# Patient Record
Sex: Female | Born: 1955 | Hispanic: No | Marital: Married | Smoking: Current some day smoker
Health system: Southern US, Community
[De-identification: ages and names within clinical notes are randomized; demographics above are authoritative.]

---

## 2007-12-24 ENCOUNTER — Encounter: Payer: Self-pay | Admitting: Family Medicine

## 2008-01-09 ENCOUNTER — Ambulatory Visit: Payer: Self-pay | Admitting: Family Medicine

## 2008-01-09 DIAGNOSIS — Z87891 Personal history of nicotine dependence: Secondary | ICD-10-CM | POA: Insufficient documentation

## 2008-01-09 DIAGNOSIS — J45909 Unspecified asthma, uncomplicated: Secondary | ICD-10-CM | POA: Insufficient documentation

## 2008-01-09 DIAGNOSIS — F209 Schizophrenia, unspecified: Secondary | ICD-10-CM | POA: Insufficient documentation

## 2008-01-10 DIAGNOSIS — K219 Gastro-esophageal reflux disease without esophagitis: Secondary | ICD-10-CM

## 2008-01-10 DIAGNOSIS — M1990 Unspecified osteoarthritis, unspecified site: Secondary | ICD-10-CM

## 2008-01-10 DIAGNOSIS — J449 Chronic obstructive pulmonary disease, unspecified: Secondary | ICD-10-CM

## 2008-01-10 DIAGNOSIS — E785 Hyperlipidemia, unspecified: Secondary | ICD-10-CM

## 2008-01-10 DIAGNOSIS — I10 Essential (primary) hypertension: Secondary | ICD-10-CM | POA: Insufficient documentation

## 2008-02-20 ENCOUNTER — Ambulatory Visit: Payer: Self-pay | Admitting: Family Medicine

## 2008-02-29 ENCOUNTER — Telehealth: Payer: Self-pay | Admitting: Family Medicine

## 2008-04-16 ENCOUNTER — Ambulatory Visit: Payer: Self-pay | Admitting: Family Medicine

## 2008-04-16 LAB — CONVERTED CEMR LAB

## 2008-04-17 ENCOUNTER — Encounter: Payer: Self-pay | Admitting: Family Medicine

## 2008-04-18 ENCOUNTER — Encounter: Payer: Self-pay | Admitting: Family Medicine

## 2008-05-13 ENCOUNTER — Encounter: Payer: Self-pay | Admitting: Family Medicine

## 2008-05-16 ENCOUNTER — Encounter: Payer: Self-pay | Admitting: Family Medicine

## 2008-05-19 LAB — CONVERTED CEMR LAB
ALT: 15 units/L (ref 0–35)
AST: 17 units/L (ref 0–37)
Albumin: 3.8 g/dL (ref 3.5–5.2)
Alkaline Phosphatase: 75 units/L (ref 39–117)
BUN: 12 mg/dL (ref 6–23)
CO2: 22 meq/L (ref 19–32)
Calcium: 9.3 mg/dL (ref 8.4–10.5)
Chloride: 108 meq/L (ref 96–112)
Creatinine, Ser: 0.9 mg/dL (ref 0.40–1.20)
Glucose, Bld: 89 mg/dL (ref 70–99)
Potassium: 4.7 meq/L (ref 3.5–5.3)
Sodium: 140 meq/L (ref 135–145)
TSH: 0.419 microintl units/mL (ref 0.350–4.50)
Total Bilirubin: 0.2 mg/dL — ABNORMAL LOW (ref 0.3–1.2)
Total Protein: 7.1 g/dL (ref 6.0–8.3)

## 2008-06-13 ENCOUNTER — Encounter: Payer: Self-pay | Admitting: Family Medicine

## 2008-07-09 ENCOUNTER — Ambulatory Visit: Payer: Self-pay | Admitting: Family Medicine

## 2008-07-28 ENCOUNTER — Telehealth (INDEPENDENT_AMBULATORY_CARE_PROVIDER_SITE_OTHER): Payer: Self-pay | Admitting: *Deleted

## 2008-07-29 ENCOUNTER — Ambulatory Visit: Payer: Self-pay | Admitting: Family Medicine

## 2008-07-30 ENCOUNTER — Telehealth: Payer: Self-pay | Admitting: Family Medicine

## 2008-08-19 ENCOUNTER — Ambulatory Visit: Payer: Self-pay | Admitting: Family Medicine

## 2008-08-19 ENCOUNTER — Telehealth: Payer: Self-pay | Admitting: *Deleted

## 2008-08-19 ENCOUNTER — Ambulatory Visit (HOSPITAL_COMMUNITY): Admission: RE | Admit: 2008-08-19 | Discharge: 2008-08-19 | Payer: Self-pay | Admitting: Family Medicine

## 2008-08-19 ENCOUNTER — Encounter: Payer: Self-pay | Admitting: Family Medicine

## 2008-08-25 ENCOUNTER — Telehealth: Payer: Self-pay | Admitting: *Deleted

## 2008-08-25 ENCOUNTER — Encounter: Payer: Self-pay | Admitting: Family Medicine

## 2008-08-25 LAB — CONVERTED CEMR LAB
Eosinophils Absolute: 0 10*3/uL (ref 0.0–0.7)
Glucose, Bld: 107 mg/dL — ABNORMAL HIGH (ref 70–99)
Lymphs Abs: 2.2 10*3/uL (ref 0.7–4.0)
MCV: 90.8 fL (ref 78.0–100.0)
Neutrophils Relative %: 63 % (ref 43–77)
Platelets: 373 10*3/uL (ref 150–400)
Potassium: 4.3 meq/L (ref 3.5–5.3)
Sodium: 138 meq/L (ref 135–145)
TSH: 0.802 microintl units/mL (ref 0.350–4.50)
WBC: 7.7 10*3/uL (ref 4.0–10.5)

## 2008-08-27 ENCOUNTER — Ambulatory Visit: Payer: Self-pay | Admitting: Family Medicine

## 2008-08-27 LAB — CONVERTED CEMR LAB: Direct LDL: 245 mg/dL — ABNORMAL HIGH

## 2008-09-01 ENCOUNTER — Encounter: Payer: Self-pay | Admitting: Family Medicine

## 2008-11-12 ENCOUNTER — Ambulatory Visit: Payer: Self-pay | Admitting: Family Medicine

## 2008-11-12 DIAGNOSIS — N949 Unspecified condition associated with female genital organs and menstrual cycle: Secondary | ICD-10-CM

## 2008-11-12 LAB — CONVERTED CEMR LAB
Albumin: 4.2 g/dL (ref 3.5–5.2)
BUN: 13 mg/dL (ref 6–23)
CO2: 24 meq/L (ref 19–32)
Calcium: 9.5 mg/dL (ref 8.4–10.5)
Chloride: 102 meq/L (ref 96–112)
Glucose, Bld: 90 mg/dL (ref 70–99)
Hemoglobin: 12.4 g/dL (ref 12.0–15.0)
MCHC: 32.2 g/dL (ref 30.0–36.0)
Potassium: 4.4 meq/L (ref 3.5–5.3)
RBC: 4.04 M/uL (ref 3.87–5.11)

## 2008-11-13 ENCOUNTER — Encounter: Payer: Self-pay | Admitting: Family Medicine

## 2008-11-19 ENCOUNTER — Telehealth: Payer: Self-pay | Admitting: Family Medicine

## 2009-05-01 ENCOUNTER — Ambulatory Visit: Payer: Self-pay | Admitting: Family Medicine

## 2009-05-01 ENCOUNTER — Encounter: Admission: RE | Admit: 2009-05-01 | Discharge: 2009-05-01 | Payer: Self-pay | Admitting: Family Medicine

## 2009-07-17 ENCOUNTER — Telehealth: Payer: Self-pay | Admitting: *Deleted

## 2009-11-18 ENCOUNTER — Ambulatory Visit: Payer: Self-pay | Admitting: Family Medicine

## 2009-11-18 LAB — CONVERTED CEMR LAB
ALT: 16 units/L (ref 0–35)
AST: 21 units/L (ref 0–37)
Albumin: 4.2 g/dL (ref 3.5–5.2)
Calcium: 9.7 mg/dL (ref 8.4–10.5)
Chloride: 101 meq/L (ref 96–112)
Potassium: 3.9 meq/L (ref 3.5–5.3)
Sodium: 139 meq/L (ref 135–145)

## 2009-11-20 ENCOUNTER — Encounter: Payer: Self-pay | Admitting: Family Medicine

## 2010-03-17 ENCOUNTER — Ambulatory Visit: Payer: Self-pay | Admitting: Family Medicine

## 2010-03-17 DIAGNOSIS — R7301 Impaired fasting glucose: Secondary | ICD-10-CM

## 2010-03-17 LAB — CONVERTED CEMR LAB
Albumin: 4.2 g/dL (ref 3.5–5.2)
Alkaline Phosphatase: 87 units/L (ref 39–117)
BUN: 12 mg/dL (ref 6–23)
CO2: 24 meq/L (ref 19–32)
Direct LDL: 233 mg/dL — ABNORMAL HIGH
Glucose, Bld: 109 mg/dL — ABNORMAL HIGH (ref 70–99)
Potassium: 4 meq/L (ref 3.5–5.3)
Total Bilirubin: 0.3 mg/dL (ref 0.3–1.2)

## 2010-03-18 ENCOUNTER — Encounter: Payer: Self-pay | Admitting: Family Medicine

## 2010-03-19 ENCOUNTER — Telehealth (INDEPENDENT_AMBULATORY_CARE_PROVIDER_SITE_OTHER): Payer: Self-pay | Admitting: Family Medicine

## 2010-03-23 ENCOUNTER — Encounter: Payer: Self-pay | Admitting: Family Medicine

## 2010-04-01 ENCOUNTER — Encounter: Payer: Self-pay | Admitting: Family Medicine

## 2010-05-26 ENCOUNTER — Ambulatory Visit: Payer: Self-pay | Admitting: Family Medicine

## 2010-05-26 DIAGNOSIS — R252 Cramp and spasm: Secondary | ICD-10-CM

## 2010-05-26 LAB — CONVERTED CEMR LAB
ALT: 14 units/L (ref 0–35)
Albumin: 3.9 g/dL (ref 3.5–5.2)
Alkaline Phosphatase: 81 units/L (ref 39–117)
Glucose, Bld: 115 mg/dL — ABNORMAL HIGH (ref 70–99)
Potassium: 4.4 meq/L (ref 3.5–5.3)
Sodium: 141 meq/L (ref 135–145)
Total Protein: 6.8 g/dL (ref 6.0–8.3)

## 2010-05-28 ENCOUNTER — Encounter: Payer: Self-pay | Admitting: Family Medicine

## 2010-07-05 ENCOUNTER — Encounter: Payer: Self-pay | Admitting: Family Medicine

## 2010-08-25 ENCOUNTER — Encounter (INDEPENDENT_AMBULATORY_CARE_PROVIDER_SITE_OTHER): Payer: Self-pay | Admitting: *Deleted

## 2010-09-08 ENCOUNTER — Ambulatory Visit: Payer: Self-pay

## 2010-09-10 ENCOUNTER — Telehealth: Payer: Self-pay | Admitting: Family Medicine

## 2010-09-15 ENCOUNTER — Telehealth: Payer: Self-pay | Admitting: Family Medicine

## 2010-10-26 NOTE — Assessment & Plan Note (Signed)
Summary: f/u,df   Vital Signs:  Patient profile:   55 year old female Height:      63 inches Weight:      181.9 pounds BMI:     32.34 Temp:     97.5 degrees F oral Pulse rate:   82 / minute BP sitting:   147 / 88  (left arm) Cuff size:   large  Vitals Entered By: Gladstone Pih (November 18, 2009 9:45 AM) CC: F/U Is Patient Diabetic? No Pain Assessment Patient in pain? yes      Onset of pain  Chronic   Primary Care Provider:  Denny Levy, MD  CC:  F/U.  History of Present Illness: f/u htn: has been seen at ED and at [facility] clinic and by her psychioatrist since I saw her and there have been some medication changes. She needs some refills. She is not sure what all they changed or why but did bring her meds today. Plans to continue f/u here. Not having as many headaches--thinks thisis improved because she has moved out of te family home into her own apartment. She is quite happy about this,,said her stress is less. She is living in a 'senior center" where they have a lot of activities.  f/u L>R knee pain--saw Dr Celene Skeen sent her to pulminary to get clearance for TKR. She can barely walk now even with her cane--wants to use a walker as she thinks it will support her better---she tried her neighbors.  f/u peripheral neuropathy--seems to be g3etting much worse---her gabapentin has been increased and she says that is helping a little bit. Is scheduled to see a neurologist by her ortho doctor---has appt todat (Dr Ninetta Lights in [place])  saw a GYN docotro who did a pap, an [place] and some kind of biopsy--she had an episode of vaginal leeding. he told her she needs a hysterectomy---she wants to get her knee fixed first.  still following at mental health center--some of those meds changed too. feeling better since she moved out into her ownplace. denies current SI/HI. Says her husband is still in touch daily--he brought her here today..  Still smooking cigarettes, not currently drinking  alcohol.  Habits & Providers  Alcohol-Tobacco-Diet     Tobacco Status: current     Tobacco Counseling: to quit use of tobacco products     Cigarette Packs/Day: 1.0  Current Medications (verified): 1)  Lotensin Hct 20-12.5 Mg Tabs (Benazepril-Hydrochlorothiazide) .Marland Kitchen.. 1 By Mouth Qd 2)  Amlodipine Besylate 10 Mg  Tabs (Amlodipine Besylate) .Marland Kitchen.. 1 By Mouth Once Daily 3)  Advair Diskus 500-50 Mcg/dose  Misc (Fluticasone-Salmeterol) .Marland Kitchen.. 1 Puff Two Times A Day 4)  Risperdal 3 Mg Tabs (Risperidone) .... 2 By Mouth At Bedtime Per Psychiatry 5)  Remeron 45 Mg Tabs (Mirtazapine) .Marland Kitchen.. 1 By Mouth At Bedtime Per Psych 6)  Vicodin 5-500 Mg Tabs (Hydrocodone-Acetaminophen) .Marland Kitchen.. 1-2 By Mouth At Bedtime As Needed Knee Pain Ros #60/month 7)  Benazepril Hcl 40 Mg  Tabs (Benazepril Hcl) .Marland Kitchen.. 1 By Mouth Qd 8)  Neurontin 300 Mg  Caps (Gabapentin) .Marland Kitchen.. 1 By Mouth Tid 9)  Clonidine Hcl 0.1 Mg Tabs (Clonidine Hcl) .Marland Kitchen.. 1 By Mouth Bid 10)  Alprazolam 0.5 Mg Tabs (Alprazolam) .Marland Kitchen.. 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   Neck:  supple, full ROM, and no masses.   Lungs:  decreased breath sounds in all lung fields, no rales, no crackles, no wheezes Heart:  normal rate, regular rhythm, and no murmur.   Abdomen:  soft and normal bowel sounds.   Msk:  L knee painful medialandlateral joint lines, very limited ROm--will extend  almost fully but flexion slow and about 80 degrees. Neurologic:  antalgic gait using canealert & oriented X3 and strength normal in all extremities.     Impression & Recommendations:  Problem # 1:  HYPERTENSION (ICD-401.9) Assessment Improved Orders: Comp Met-[facility] ([id number]) [facility]- Est  Level 4 ([address]214)refill her meds unclear to me who changed some of her meds or why but better control today than at some points in past  Problem # 2:  MENORRHAGIA, PERIMENOPAUSAL (ICD-626.8)  she is evidently seeing a gynecologist in [facility] for this and I will let her follow up w him---he has recommended hysterectomy according to her. 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 Will give her small amt narcotic for pain relief until this accomplished Orders: [facility]- Est  Level 4 ([address])  Complete Medication List: 1)  Lotensin Hct 20-12.5 Mg Tabs (Benazepril-hydrochlorothiazide) .Marland Kitchen.. 1 by mouth qd 2)  Amlodipine Besylate 10 Mg Tabs (Amlodipine besylate) .Marland Kitchen.. 1 by mouth once daily 3)  Advair Diskus 500-50 Mcg/dose Misc (Fluticasone-salmeterol) .Marland Kitchen.. 1 puff two times a day 4)  Risperdal 3 Mg Tabs (Risperidone) .... 2 by mouth at bedtime per psychiatry 5)  Remeron 45 Mg Tabs (Mirtazapine) .Marland Kitchen.. 1 by mouth at bedtime per psych 6)  Vicodin 5-500 Mg Tabs (Hydrocodone-acetaminophen) .Marland Kitchen.. 1-2 by mouth at bedtime as needed knee pain ros #60/month 7)  Neurontin 300 Mg Caps (Gabapentin) .Marland Kitchen.. 1 by mouth tid 8)  Clonidine Hcl 0.1 Mg Tabs (Clonidine hcl) .Marland Kitchen.. 1 by mouth bid 9)  Alprazolam 0.5 Mg Tabs (Alprazolam) .Marland Kitchen.. 1 by mouth at bedtime per psychiatry 10)  Benztropine Mesylate 2  Mg Tabs (Benztropine mesylate) .Marland Kitchen.. 1 by mouth once daily per psychiatry T-Lipoprotein (LDL cholesterol)  ([id number]) Comp Met-[facility] ([id number]) [facility]- Est  Level 4 (03474)  Patient Instructions: 1)  RTC 2-3 m 2)  get me what records you can from your other doctors, esp gynecologist.  3)  we will check some blood work today and I will send you a letter. great to see you! Prescriptions: CLONIDINE HCL 0.1 MG TABS (CLONIDINE HCL) 1 by mouth bid  #60 x 5   Entered and Authorized by:   Denny Levy MD   Signed by:   Denny Levy MD on 11/18/2009   Method used:   Electronically to        [facility] [phone]* (retail)       [address]       [place], [place]  [address]       Ph: [phone]  Fax: [phone]   RxID:   [id number] NEURONTIN 300 MG  CAPS (GABAPENTIN) 1 by mouth tid  #90 x 5   Entered and Authorized by:   Denny Levy MD   Signed by:   Denny Levy MD on 11/18/2009   Method used:   Electronically to        [facility] [phone]* (retail)       [address]       [place], [place]  [address]       Ph: [phone]       Fax: [phone]   RxID:   [id number] AMLODIPINE BESYLATE 10 MG  TABS (AMLODIPINE BESYLATE) 1 by mouth once daily  #90 x 3   Entered and Authorized by:   Denny Levy MD   Signed by:   Denny Levy MD on 11/18/2009   Method used:   Electronically to        [facility] [phone]* (retail)       [address]       [place], [place]  [address]       Ph: [phone]       Fax: [phone]   RxID:   [id number] LOTENSIN HCT 20-12.5 MG TABS (BENAZEPRIL-HYDROCHLOROTHIAZIDE) 1 by mouth qd  #30 x 12   Entered and Authorized by:   Denny Levy MD   Signed by:   Denny Levy MD on 11/18/2009   Method used:   Electronically to        [facility] [phone]* (retail)       [address]       [place], [place]  [address]       Ph: [phone]       Fax: [phone]   RxID:   [id number]   Prevention & Chronic  Care Immunizations   Influenza vaccine: given  (07/09/2008)   Influenza vaccine due: 07/09/2009    Tetanus booster: 12/26/2003: given   Tetanus booster due: 12/25/2013    Pneumococcal vaccine: Not documented  Colorectal Screening   Hemoccult: Not documented   Hemoccult due: Not Indicated    Colonoscopy: normal  (06/27/2007)   Colonoscopy due: 06/26/2017  Other Screening   Pap smear: Not documented   Pap smear action/deferral: Not indicated-other  (11/18/2009)    Mammogram: normal  (02/09/2007)   Mammogram due: 02/09/2008   Smoking status: current  (11/18/2009)   Smoking cessation counseling: yes  (11/12/2008)  Lipids   Total Cholesterol: Not documented   Lipid panel action/deferral: LDL Direct Ordered   LDL: Not documented   LDL Direct: 245  (08/27/2008)   HDL: Not documented   Triglycerides: Not documented    SGOT (AST): 17  (11/12/2008)   SGPT (ALT): 16  (11/12/2008) CMP ordered    Alkaline phosphatase: 83  (11/12/2008)   Total bilirubin: 0.2  (11/12/2008)    Lipid flowsheet reviewed?: Yes   Progress toward LDL goal: Improved  Hypertension   Last Blood Pressure: 147 / 88  (11/18/2009)   Serum creatinine: 0.82  (11/12/2008)   Serum potassium 4.4  (11/12/2008) CMP ordered     Hypertension flowsheet reviewed?: Yes   Progress toward BP goal: Unchanged  Self-Management Support :   Personal Goals (by the next clinic visit) :      Personal blood pressure goal: 140/90  (11/18/2009)   Patient will work on the following items until the next clinic visit to reach self-care goals:  Medications and monitoring: take my medicines every day  (11/18/2009)     Eating: limit or avoid alcohol  (11/18/2009)    Hypertension self-management support: Written self-care plan  (11/18/2009)   Hypertension self-care plan printed.    Lipid self-management support: Not documented     Impression & Recommendations:  Her updated medication list for this problem includes:     Lotensin Hct 20-12.5 Mg Tabs (Benazepril-hydrochlorothiazide) .Marland Kitchen... 1 by mouth qd    Amlodipine Besylate 10 Mg Tabs (Amlodipine besylate) .Marland Kitchen... 1 by mouth once daily    Benazepril Hcl 40 Mg Tabs (Benazepril hcl) .Marland Kitchen... 1 by mouth qd    Clonidine Hcl 0.1 Mg Tabs (Clonidine hcl) .Marland Kitchen... 1 by mouth bid  Orders: Comp Met-[facility] ([id number])  Her updated medication list for this problem includes:    Lotensin Hct 20-12.5 Mg Tabs (Benazepril-hydrochlorothiazide) .Marland Kitchen... 1 by mouth qd    Amlodipine Besylate 10 Mg Tabs (Amlodipine besylate) .Marland Kitchen... 1 by mouth once daily    Benazepril Hcl 40 Mg Tabs (Benazepril hcl) .Marland Kitchen... 1 by mouth qd    Clonidine Hcl 0.1 Mg Tabs (Clonidine hcl) .Marland Kitchen... 1 by mouth bid  Her updated medication list for this problem includes:    Lotensin Hct 20-12.5 Mg Tabs (Benazepril-hydrochlorothiazide) .Marland Kitchen... 1 by mouth qd    Amlodipine Besylate 10 Mg Tabs (Amlodipine besylate) .Marland Kitchen... 1 by mouth once daily    Benazepril Hcl 40 Mg Tabs (Benazepril hcl) .Marland Kitchen... 1 by mouth qd    Clonidine Hcl 0.1 Mg Tabs (Clonidine hcl) .Marland Kitchen... 1 by mouth bid  The following medications were removed from the medication list:    Benazepril Hcl 40 Mg Tabs (Benazepril hcl) .Marland Kitchen... 1 by mouth qd  Her updated medication list for this problem includes:    Lotensin Hct 20-12.5 Mg Tabs (Benazepril-hydrochlorothiazide) .Marland Kitchen... 1 by mouth qd    Amlodipine Besylate 10 Mg Tabs (Amlodipine besylate) .Marland Kitchen... 1 by mouth once daily    Clonidine Hcl 0.1 Mg Tabs (Clonidine hcl) .Marland Kitchen... 1 by mouth bid   Orders: Comp Met-[facility] [phone]) [facility]- Est  Level 4 (84696)     Orders: [facility]- Est  Level 4 ([address])    Orders: T-Lipoprotein (LDL cholesterol)  ([id number]) Comp Met-[facility] ([id number])   Orders: T-Lipoprotein (LDL cholesterol)  ([id number]) Comp Met-[facility] ([id number]) [facility]- Est  Level 4 (88416)    The following medications were removed from the medication list:    Hydrocodone-acetaminophen 5-500 Mg Tabs  (Hydrocodone-acetaminophen) .Marland Kitchen... 1 tab by mouth q 6 hours as needed cough for next week  Her updated medication list for this problem includes:    Vicodin 5-500 Mg Tabs (Hydrocodone-acetaminophen) .Marland Kitchen... 1-2 by mouth at bedtime as needed knee pain ros #60/month  The following medications were removed from the medication list:    Hydrocodone-acetaminophen 5-500 Mg Tabs (Hydrocodone-acetaminophen) .Marland Kitchen... 1 tab by mouth q 6 hours as needed cough for next week  Her updated medication list for this problem includes:    Vicodin 5-500 Mg Tabs (Hydrocodone-acetaminophen) .Marland Kitchen... 1-2 by mouth at bedtime as needed knee pain ros #60/month  The following medications were removed from the medication list:    Hydrocodone-acetaminophen 5-500 Mg Tabs (Hydrocodone-acetaminophen) .Marland Kitchen... 1 tab by mouth q 6 hours as needed cough for next week  Her updated medication list for this problem includes:    Vicodin 5-500 Mg Tabs (Hydrocodone-acetaminophen) .Marland Kitchen... 1-2 by mouth at bedtime as needed knee pain ros #60/month   Orders: [facility]- Est  Level 4 (60630)    Complete Medication List: 1)  Lotensin Hct 20-12.5 Mg Tabs (Benazepril-hydrochlorothiazide) .Marland Kitchen.. 1 by mouth qd 2)  Amlodipine Besylate 10 Mg Tabs (Amlodipine besylate) .Marland Kitchen.. 1 by mouth once daily 3)  Advair Diskus 500-50 Mcg/dose Misc (Fluticasone-salmeterol) .Marland Kitchen.. 1 puff two times a day 4)  Risperdal 3 Mg Tabs (Risperidone) .... 2 by mouth at bedtime per psychiatry 5)  Remeron 45 Mg Tabs (Mirtazapine) .Marland Kitchen.. 1 by mouth at bedtime per psych 6)  Vicodin 5-500 Mg Tabs (Hydrocodone-acetaminophen) .Marland Kitchen.. 1-2 by mouth at bedtime as needed knee pain ros #60/month 7)  Neurontin 300 Mg Caps (Gabapentin) .Marland Kitchen.. 1 by mouth tid 8)  Clonidine Hcl 0.1 Mg Tabs (Clonidine hcl) .Marland Kitchen.. 1 by mouth bid 9)  Alprazolam 0.5 Mg Tabs (Alprazolam) .Marland Kitchen.. 1 by mouth at bedtime per psychiatry 10)  Benztropine Mesylate 2 Mg Tabs (Benztropine mesylate) .Marland Kitchen.. 1 by mouth once daily per psychiatry

## 2010-10-26 NOTE — Letter (Signed)
Summary: Generic Letter  [facility]  [address]   [place], [place] [address]   Phone: [phone]  Fax: [phone]    04/01/2010  REGARDING:  JUNELL CULLIFER [address] [place], [place]  [address]     TO: Whom it may concern Re: Housing situation for: JACYNDA BRUNKE  [place], [place]  [address]  Please allow Mrs. Keltz's son, Dante Gang, to stay with Mrs. Grosso at various times throughout the year. Mrs. Diego is having severe arthritis pproblems in her knees, and at times her son may be staying with her to help her with activities of daily living, cleaning etc.  Please contact me with questions.               Sincerely,   Denny Levy MD

## 2010-10-26 NOTE — Letter (Signed)
Summary: Generic Letter: Housing  [facility]  [address]   [place], [place] [address]   Phone: [phone]  Fax: [phone]    03/19/2010   TO: Whom it may concern Re: Housing situation for: Christine Hebert  [place], [place]  [address]  Please allow Christine Hebert's son, Christine Hebert, to stay with Christine Hebert at various times throughout the year. Christine Hebert is having severe arthritis pproblems in her knees, and at times her son may be staying with her to help her with activities of daioly living, cleaning etc.  Please contact me with questions.          Sincerely,   Denny Levy MD  Appended Document: Generic Letter: Housing mailed  Appended Document: Generic Letter: Housing pt called and said that her landlord questioned the letter (shoe thought the pt had written it) and would like another one sent- needs to be addressed to [facility], [address], [place], [address] attn: management  letter needs to be rewritten w/ no typo's and hand signed

## 2010-10-26 NOTE — Letter (Signed)
Summary: [facility]  [address]   [place], [place] [address]   Phone: [phone]  Fax: [phone]    03/18/2010  Christine Hebert [address] [place], [place]  [address]  Dear [name],   You have only very MILDLY elevated blood sygar. I would NOT recommend we restart the metformin or any other diabetes drugs atthis time. We will continue to folow that.  All of your other blood work was satisfactory as well.  Great to see you!        Sincerely,   Denny Levy MD  Appended Document: LABLetter MAILED.

## 2010-10-26 NOTE — Assessment & Plan Note (Signed)
Summary: f/u,df   Vital Signs:  Patient profile:   55 year old female Weight:      181.7 pounds Temp:     97.8 degrees F oral Pulse rate:   75 / minute Pulse rhythm:   regular BP sitting:   149 / 89  (left arm) Cuff size:   large  Vitals Entered By: Loralee Pacas CMA (May 26, 2010 10:20 AM)  Primary Care Provider:  Denny Levy, MD   History of Present Illness: 1) left shoulderpain--worse with any activities above shoulder height. No injury. Insiduous. Painis aching. Sleep disruption as she cannot sleep on that side. No nymbness or tingling in arm or hand. Righthand dominant  2) recent 9 day hospitalization at [facility] inpat psych unit. Was haveing paranoia and hallucinations. They changed some of her meds. Rush Landmark is tempporarily back in her husbands home, but plans to move back toher own apartment. Not having any suicidal or homicidal ideations at his time--feels much better since d/c. Unclear if maybe she went off her meds for a while prior to admit.   3) Muscle cramps--especially feet. Sometimes awakens her from sleep. Resolves with standing usually.  Intermittent. Occasionally hands.  Current Medications (verified): 1)  Lotensin Hct 20-12.5 Mg Tabs (Benazepril-Hydrochlorothiazide) .Marland Kitchen.. 1 By Mouth Qd 2)  Advair Diskus 500-50 Mcg/dose  Misc (Fluticasone-Salmeterol) .Marland Kitchen.. 1 Puff Two Times A Day 3)  Invega 9 Mg Xr24h-Tab (Paliperidone) .... Per Psych 4)  Vicodin 5-500 Mg Tabs (Hydrocodone-Acetaminophen) .Marland Kitchen.. 1-2 By Mouth At Bedtime As Needed Knee Pain Ros #60/month 5)  Clonidine Hcl 0.1 Mg Tabs (Clonidine Hcl) .Marland Kitchen.. 1 By Mouth Bid 6)  Buspirone Hcl 10 Mg Tabs (Buspirone Hcl) .... Per Psych 7)  Benztropine Mesylate 2 Mg Tabs (Benztropine Mesylate) .Marland Kitchen.. 1 By Mouth Once Daily Per Psychiatry  Allergies: 1)  * Clindamycin 2)  * Metformin  Review of Systems  The patient denies anorexia, fever, weight loss, weight gain, and dyspnea on exertion.   Psych:  Complains of easily tearful and  mental problems; denies alternate hallucination ( auditory/visual), anxiety, depression, easily angered, panic attacks, suicidal thoughts/plans, unusual visions or sounds, and thoughts /plans of harming others; Please see HPI for additional ROS. .  Physical Exam  General:  alert, well-developed, well-nourished, and well-hydrated.   Neck:  supple, full ROM, and no masses.   Lungs:  normal respiratory effort and normal breath sounds.   Heart:  normal rate, regular rhythm, and no murmur.   Msk:  Left shoulder pain with abduction laterally above shoulder height and forward flexion above 120 degrees but full strength in all planes of rotator cuff muscles. Distally neurovascularly intact. Neurologic:  alert & oriented X3.   Psych:  memory intact for recent and remote.  some pressured speech and a bit of repeptiion and rambling, but I could generally follow her train of thought. Additional Exam:  Patient given informed consent for injection. Discussed possible complications of infection, bleeding or skin atrophy at site of injection. Possible side effect of avascular necrosis (focal area of bone death) due to steroid use.Appropriate verbal time out taken Are cleaned and prepped in usual sterile fashion. A --1-- cc kennalog plus ---4-cc 1% lidocaine without epinephrine was injected into the-left subacromial bursa using posterior approach--. Patient tolerated procedure well with no complications.    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 Has good f/up arranged with Dr Derryl Harbor ad MH  Problem # 3:  OSTEOARTHRITIS (ICD-715.90)  Her updated medication list for this problem includes:    Vicodin 5-500 Mg Tabs (Hydrocodone-acetaminophen) .Marland Kitchen... 1-2 by  mouth at bedtime as needed knee pain ros #60/month  Orders: [facility]- Est  Level 4 ([address]214) left shoulder injection  Problem # 4:  IMPAIRED FASTING GLUCOSE (ICD-790.21) again we discussed this will follow  Complete Medication List: 1)  Lotensin Hct 20-12.5 Mg Tabs (Benazepril-hydrochlorothiazide) .Marland Kitchen.. 1 by mouth qd 2)  Advair Diskus 500-50 Mcg/dose Misc (Fluticasone-salmeterol) .Marland Kitchen.. 1 puff two times a day 3)  Invega 9 Mg Xr24h-tab (Paliperidone) .... Per psych 4)  Vicodin 5-500 Mg Tabs (Hydrocodone-acetaminophen) .Marland Kitchen.. 1-2 by mouth at bedtime as needed knee pain ros #60/month 5)  Clonidine Hcl 0.1 Mg Tabs (Clonidine hcl) .Marland Kitchen.. 1 by mouth bid 6)  Buspirone Hcl 10 Mg Tabs (Buspirone hcl) .... Per psych 7)  Benztropine Mesylate 2 Mg Tabs (Benztropine mesylate) .Marland Kitchen.. 1 by mouth once daily per psychiatry  Other Orders: Comp Met-[facility] [phone])

## 2010-10-26 NOTE — Letter (Signed)
Summary: [facility]  [address]   [place], [place] [address]   Phone: [phone]  Fax: [phone]    05/28/2010  [facility] [address], [address] [place], [place]  [address]  Dear Ms. Fees,   Thyroid and electrolytes normal. I have no idea why you are having foot and hand cramps. Nothing here to explain it.        Sincerely,   Denny Levy MD

## 2010-10-26 NOTE — Miscellaneous (Signed)
  Clinical Lists Changes  Medications: Added new medication of SIMVASTATIN 80 MG TABS (SIMVASTATIN) 1 by mouth qd - Signed Rx of SIMVASTATIN 80 MG TABS (SIMVASTATIN) 1 by mouth qd;  #30 x 12;  Signed;  Entered by: Denny Levy MD;  Authorized by: Denny Levy MD;  Method used: Electronically to [facility] [phone]*, [address], [place], [place]  [address], Ph: [phone], Fax: [phone]    Prescriptions: SIMVASTATIN 80 MG TABS (SIMVASTATIN) 1 by mouth qd  #30 x 12   Entered and Authorized by:   Denny Levy MD   Signed by:   Denny Levy MD on 11/20/2009   Method used:   Electronically to        [facility] [phone]* (retail)       [address]       [place], [place]  [address]       Ph: [phone]       Fax: [phone]   RxID:   [id number]

## 2010-10-26 NOTE — Progress Notes (Signed)
Summary: meds  Phone Note Call from Patient Call back at [phone]   Caller: Patient Summary of Call: pt is asking about meds that were supposed to be called in the other day [facility] [address] Initial call taken by: De Nurse,  March 19, 2010 11:50 AM  Follow-up for Phone Call        DEAR Raechelle Sarti TEAM please call in as below Thanks!  Denny Levy MD  March 19, 2010 2:48 PM     Prescriptions: ALPRAZOLAM 0.5 MG TABS (ALPRAZOLAM) 1 by mouth at bedtime per psychiatry  #30 x 0   Entered and Authorized by:   Denny Levy MD   Signed by:   Denny Levy MD on 03/19/2010   Method used:   Telephoned to ...       [facility] [phone]* (retail)       [address]       [place], [place]  [address]       Ph: [phone]       Fax: [phone]   RxID:   [id number] CLONIDINE HCL 0.1 MG TABS (CLONIDINE HCL) 1 by mouth bid  #60 x 5   Entered and Authorized by:   Denny Levy MD   Signed by:   Denny Levy MD on 03/19/2010   Method used:   Telephoned to ...       [facility] [phone]* (retail)       [address]       [place], [place]  [address]       Ph: [phone]       Fax: [phone]   RxID:   [id number]  rx called in and informed by Archie Patten on Friday.Gladstone Pih  March 22, 2010 9:12 AM

## 2010-10-26 NOTE — Miscellaneous (Signed)
  Clinical Lists Changes  Problems: Changed problem from ASTHMA, EXTRINSIC (ICD-493.00) to ASTHMA, PERSISTENT, SEVERE (ICD-493.90)

## 2010-10-26 NOTE — Letter (Signed)
Summary: LAB Letter  [facility]  [address]   [place], [place] [address]   Phone: [phone]  Fax: [phone]    11/20/2009  Christine Hebert [address] [place], [place]  [address]  Dear Ms. Petrovich,  Your blood sugar, kidney function, liver function and electrolytes looked normal. Your LD cholesterol, which I would like close to 100 is 208. We need to consider putting you back on a cholesterol medicine.  I have sent a prescription to your pharmacy for a good cholesterol medicine. Please start taking one  a day. We will recheck when I see you back. If you have questions or concerns, call me.  It was great to see you--congratulations on your new apartment!         Sincerely,   Denny Levy MD  Appended Document: LAB Letter mailed.

## 2010-10-26 NOTE — Assessment & Plan Note (Signed)
Summary: f/up,tcb   Vital Signs:  Patient profile:   55 year old female Height:      63 inches Weight:      181 pounds BMI:     32.18 Temp:     97.7 degrees F oral Pulse rate:   69 / minute BP sitting:   118 / 80  (left arm) Cuff size:   large  Vitals Entered By: Tessie Fass CMA (March 17, 2010 9:06 AM)  CC: F/U Is Patient Diabetic? No Pain Assessment Patient in pain? yes     Location: bilateral leg Intensity: 9   Primary Care Provider:  Denny Levy, MD  CC:  F/U.  History of Present Illness: Follow up hypertension. Taking medicines regularly with no problems. Not having any any headaches or chest pains.  Was seen by a doctor in [place] (?) and told she was pre-diabetic. they put her on metformin  and she took it for the last 3-4 weeks but has had continuous GI upset with diarrhea since. She has questions about whether or not she is diabetic and if she needs the medicine. She has stopped taking it.  She has an appointment with a GI doctor for further eval---Dr Gibson Ramp.  Mood has been pretty stable--she has run out of her alprazolam and had to reschedule her appt at [facility]wants to knoe if I can write her rx for 2 weeks to tide her over, Anxiety is same--just when she does not have her meds she gets anxious and paranoid. She continues on her other  meds the same.  Still living in her apartment--her son Bam (hayward Fayrene Fearing) has been occasionally staying with her to help with her house cleaning and ADLs. Her knee pain is significantly worse---using a cane--has had at least one episode where she walked across the street to a park and sat for a while---then had to call family to pick her up as she cold not return to apartment, (knee pain)  Habits & Providers  Alcohol-Tobacco-Diet     Tobacco Status: current     Tobacco Counseling: to quit use of tobacco products     Cigarette Packs/Day: 1.0  Current Medications (verified): 1)  Lotensin Hct 20-12.5 Mg Tabs  (Benazepril-Hydrochlorothiazide) .Marland Kitchen.. 1 By Mouth Qd 2)  Advair Diskus 500-50 Mcg/dose  Misc (Fluticasone-Salmeterol) .Marland Kitchen.. 1 Puff Two Times A Day 3)  Risperdal 3 Mg Tabs (Risperidone) .... 2 By Mouth At Bedtime Per Psychiatry 4)  Remeron 45 Mg Tabs (Mirtazapine) .Marland Kitchen.. 1 By Mouth At Bedtime Per Psych 5)  Vicodin 5-500 Mg Tabs (Hydrocodone-Acetaminophen) .Marland Kitchen.. 1-2 By Mouth At Bedtime As Needed Knee Pain Ros #60/month 6)  Neurontin 300 Mg  Caps (Gabapentin) .Marland Kitchen.. 1 By Mouth Bid 7)  Clonidine Hcl 0.1 Mg Tabs (Clonidine Hcl) .Marland Kitchen.. 1 By Mouth Bid 8)  Alprazolam 0.5 Mg Tabs (Alprazolam) .Marland Kitchen.. 1 By Mouth At Bedtime Per Psychiatry 9)  Benztropine Mesylate 2 Mg Tabs (Benztropine Mesylate) .Marland Kitchen.. 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 Hx of positive exercise ECHO with subsequent Cardiac CATH 11/2004 = non obstructive colonoscopy and EGD 2008--normal except for spasm (Dr Cassandria Anger in [facility]) vascular surgeon work up in [facility] Sept or Oct 2009--reportedlyneg  Past Surgical History: Last updated: 11/12/2008 cholecystectomy multiple  knee arthroscopes  EGd and colonoscopy 2008  Social History: married to Mike--intermittent marital discord.---currently living separate homes (02/2010) son commmiteed suicide 2006 intermittent alcohol and MJ use/abuse current and longstanding tobacco use -now 1/2-1 ppd no regular exercise 2 to extreme DJD knees does not drive  Physical Exam  General:  alert, well-developed, well-nourished, and well-hydrated.   Neck:  supple, full ROM, no masses, no thyromegaly, and no carotid bruits.   Lungs:  normal respiratory effort and normal breath  sounds.   Heart:  normal rate, regular rhythm, and no murmur.   Abdomen:  soft.   Msk:  B knees with external changes of OA. B medial joint line tenderness. Lacks full extension 10 degrees onleft, otherwise FROm.  GAIT: extremely antalgic--uses cane   Difficulty rising from a chair and bearing weight initiallydue to severe B knee pain. Once she bears weight for a few seconds. then she ambulates with cane. Neurologic:  strength normal in all extremities.   Psych:  Sometimes tangential, appears at baseline.Oriented X3, memory intact for recent and remote, not agitated, not suicidal, and not homicidal.     Impression & Recommendations:  Problem # 1:  IMPAIRED FASTING GLUCOSE (ICD-790.21)  Orders: A1C-[facility] ([address]) [facility]- Est  Level 4 ([address]) will check labs incl A1C  Problem # 2:  HYPERTENSION (ICD-401.9)  Orders: Comp Met-[facility] ([id number]) [facility]- Est  Level 4 (29562) not sure when she stopped her norvasc--it is not in her med bag today--but she appears controlled BP so no med changes  Problem # 3:  OSTEOARTHRITIS (ICD-715.90)  Orders: [facility]- Est  Level 4 (13086) she needs to consider returning to ortho for further eval--rx for wheelchair given discussed ortho with her and her husband and they are going to consider it, but have other things on theior list in front of that  Problem # 4:  HYPERLIPIDEMIA (ICD-272.4)  Orders: Direct LDL-[facility] ([id number])[facility] has evidently stopped her simvastatin as well--she does not recall when or why.  will see where we are on the LDL [facility]- Est  Level 4 ([address])   Complete Medication List: 1)  Lotensin Hct 20-12.5 Mg Tabs (Benazepril-hydrochlorothiazide) .Marland Kitchen.. 1 by mouth qd 2)  Advair Diskus 500-50 Mcg/dose Misc (Fluticasone-salmeterol) .Marland Kitchen.. 1 puff two times a day 3)  Risperdal 3 Mg Tabs (Risperidone) .... 2 by mouth at bedtime per psychiatry 4)  Remeron 45 Mg Tabs (Mirtazapine) .Marland Kitchen.. 1 by mouth at bedtime per psych 5)  Vicodin 5-500 Mg Tabs  (Hydrocodone-acetaminophen) .Marland Kitchen.. 1-2 by mouth at bedtime as needed knee pain ros #60/month 6)  Neurontin 300 Mg Caps (Gabapentin) .Marland Kitchen.. 1 by mouth bid 7)  Clonidine Hcl 0.1 Mg Tabs (Clonidine hcl) .Marland Kitchen.. 1 by mouth bid 8)  Alprazolam 0.5 Mg Tabs (Alprazolam) .Marland Kitchen.. 1 by mouth at bedtime per psychiatry 9)  Benztropine Mesylate 2 Mg Tabs (Benztropine mesylate) .Marland Kitchen.. 1 by mouth once daily per psychiatry A1C-[facility] [phone]) Comp Met-[facility] [id number]) Direct LDL-[facility] [phone]) [facility]- Est  Level 4 ([address])  Laboratory Results   Blood Tests   Date/Time Received: March 17, 2010 9:48 AM  Date/Time Reported: March 17, 2010 10:29 AM   HGBA1C: 6.5%   (Normal Range: Non-Diabetic - 3-6%   Control Diabetic - 6-8%)  Comments: ...............test performed by......Marland KitchenBonnie A. Swaziland, MLS (ASCP)cm       Impression & Recommendations:  Orders: A1C-[facility] [phone]) [facility]- Est  Level 4 (29518)   The following medications were removed from the medication list:    Amlodipine Besylate 10 Mg Tabs (Amlodipine besylate) .Marland KitchenMarland KitchenMarland KitchenMarland Kitchen 1  by mouth once daily  Her updated medication list for this problem includes:    Lotensin Hct 20-12.5 Mg Tabs (Benazepril-hydrochlorothiazide) .Marland Kitchen... 1 by mouth qd    Clonidine Hcl 0.1 Mg Tabs (Clonidine hcl) .Marland Kitchen... 1 by mouth bid  Orders: Comp Met-[facility] ([id number]) [facility]- Est  Level 4 (78469)   Her updated medication list for this problem includes:    Vicodin 5-500 Mg Tabs (Hydrocodone-acetaminophen) .Marland Kitchen... 1-2 by mouth at bedtime as needed knee pain ros #60/month  Orders: [facility]- Est  Level 4 (62952)   The following medications were removed from the medication list:    Simvastatin 80 Mg Tabs (Simvastatin) .Marland Kitchen... 1 by mouth qd  Orders: Direct LDL-[facility] ([id number]) [facility]- Est  Level 4 ([address]253)   Complete Medication List: 1)  Lotensin Hct 20-12.5 Mg Tabs (Benazepril-hydrochlorothiazide) .Marland Kitchen.. 1 by mouth qd 2)  Advair Diskus 500-50 Mcg/dose Misc (Fluticasone-salmeterol) .Marland Kitchen.. 1 puff two times a  day 3)  Risperdal 3 Mg Tabs (Risperidone) .... 2 by mouth at bedtime per psychiatry 4)  Remeron 45 Mg Tabs (Mirtazapine) .Marland Kitchen.. 1 by mouth at bedtime per psych 5)  Vicodin 5-500 Mg Tabs (Hydrocodone-acetaminophen) .Marland Kitchen.. 1-2 by mouth at bedtime as needed knee pain ros #60/month 6)  Neurontin 300 Mg Caps (Gabapentin) .Marland Kitchen.. 1 by mouth bid 7)  Clonidine Hcl 0.1 Mg Tabs (Clonidine hcl) .Marland Kitchen.. 1 by mouth bid 8)  Alprazolam 0.5 Mg Tabs (Alprazolam) .Marland Kitchen.. 1 by mouth at bedtime per psychiatry 9)  Benztropine Mesylate 2 Mg Tabs (Benztropine mesylate) .Marland Kitchen.. 1 by mouth once daily per psychiatry

## 2010-10-26 NOTE — Miscellaneous (Signed)
Summary: medical record request  Clinical Lists Changes  Rec'd medical record request to be sent to [facility] date sent: 06/11/10 Marily Memos  August 25, 2010 9:58 AM

## 2010-10-28 NOTE — Progress Notes (Signed)
  Phone Note Refill Request   Need refill for the Benazepril-Hctz 20-12 and the Omerazole to [facility] on [address]  Initial call taken by: Abundio Miu,  September 10, 2010 9:34 AM Caller: Patient    New/Updated Medications: OMEPRAZOLE 40 MG CPDR (OMEPRAZOLE) 1 by mouth qd Prescriptions: OMEPRAZOLE 40 MG CPDR (OMEPRAZOLE) 1 by mouth qd  #90 x 3   Entered and Authorized by:   Denny Levy MD   Signed by:   Denny Levy MD on 09/12/2010   Method used:   Electronically to        [facility] [address] [phone]* (retail)       [address]       [place], [place]  [address]       Ph: [phone]       Fax: [phone]   RxID:   [id number] LOTENSIN HCT 20-12.5 MG TABS (BENAZEPRIL-HYDROCHLOROTHIAZIDE) 1 by mouth qd  #90 x 3   Entered and Authorized by:   Denny Levy MD   Signed by:   Denny Levy MD on 09/12/2010   Method used:   Electronically to        [facility] [phone]* (retail)       [address]       [place], [place]  [address]       Ph: [phone]       Fax: [phone]   RxID:   [id number]

## 2010-10-28 NOTE — Progress Notes (Signed)
Summary: vicodin rx  Phone Note Call from Patient   Caller: Patient Call For: [phone] Summary of Call: Pt did not receive rx for her Vicodan.  Was suppose to have sent to pharmacy on her visit along with her bp med.  Received that but not pain med.  Please contact pharmacy for refill and let pat know when ready.  [facility] [address] is where it's to be called in.  Need to know if patient has diabetes to inform her dentist regarding some surgery she's suppose to have. Initial call taken by: Abundio Miu,  September 15, 2010 9:44 AM  Follow-up for Phone Call        tried to call patient to discuss this . phone number left  states  "your call cannot be completed as dialed. " Number in IDX states same.  will forward to Dr. Swaziland preceptor today about refill.   Follow-up by: Theresia Lo RN,  September 15, 2010 10:12 AM  Additional Follow-up for Phone Call Additional follow up Details #1::        Per Dr. Donnetta Hail note, she was going to increase her vicodin.  Will give rx for enough to last until Dr. Jennette Kettle returns. Additional Follow-up by: Sarah Swaziland MD,  September 15, 2010 12:38 PM    New/Updated Medications: VICODIN 5-500 MG TABS (HYDROCODONE-ACETAMINOPHEN) 1 by mouth q 6 hours as needed for severe pain Prescriptions: VICODIN 5-500 MG TABS (HYDROCODONE-ACETAMINOPHEN) 1 by mouth q 6 hours as needed for severe pain  #28 x 0   Entered and Authorized by:   Sarah Swaziland MD   Signed by:   Sarah Swaziland MD on 09/15/2010   Method used:   Telephoned to ...       [facility] [phone]* (retail)       [address]       [place], [place]  [address]       Ph: [phone]       Fax: [phone]   RxID:   [id number]  Rx called pharmacy. Theresia Lo RN  September 15, 2010 1:39 PM

## 2010-10-28 NOTE — Assessment & Plan Note (Signed)
Summary: f/up,tcb   Vital Signs:  Patient profile:   55 year old female Height:      63 inches Weight:      183.7 pounds BMI:     32.66 Temp:     97.7 degrees F oral Pulse rate:   76 / minute BP sitting:   150 / 93  (left arm) Cuff size:   regular  Vitals Entered By: Jimmy Footman, CMA (September 08, 2010 10:25 AM) CC: follow-up visit Is Patient Diabetic? Yes Did you bring your meter with you today? No   Primary Care Provider:  Denny Levy, MD  CC:  follow-up visit.  History of Present Illness: 1) B knee pain: had her TKR set but there was a problem with the paperwork---got delayed. She is upset. Is having a lot of pain. Needs some vicodin. had the repeat heart and lung tests done at [facility].  2) mARITAL STRESSORS SOME BETTER--SHE HAS MOVED BACK IN WITH HER HUSBAND.  3) [facility]INUES [facility]--IS ON her meds and taking them regularly. trying to avoid her family. Still smoking cigarettes, occasional MJ. no other illicits and no alcohol. he psychiatrist has changed her to oral integra (from the shots). she is also on buspar.   Habits & Providers  Alcohol-Tobacco-Diet     Tobacco Status: current  Current Medications (verified): 1)  Lotensin Hct 20-12.5 Mg Tabs (Benazepril-Hydrochlorothiazide) .Marland Kitchen.. 1 By Mouth Qd 2)  Advair Diskus 500-50 Mcg/dose  Misc (Fluticasone-Salmeterol) .Marland Kitchen.. 1 Puff Two Times A Day 3)  Invega 9 Mg Xr24h-Tab (Paliperidone) .... Per Psych 4)  Vicodin 5-500 Mg Tabs (Hydrocodone-Acetaminophen) .Marland Kitchen.. 1-2 By Mouth At Bedtime As Needed Knee Pain Ros #60/month 5)  Clonidine Hcl 0.1 Mg Tabs (Clonidine Hcl) .Marland Kitchen.. 1 By Mouth Bid 6)  Buspirone Hcl 10 Mg Tabs (Buspirone Hcl) .... Per Psych 7)  Benztropine Mesylate 2 Mg Tabs (Benztropine Mesylate) .Marland Kitchen.. 1 By Mouth Once Daily Per Psychiatry  Allergies (verified): 1)  * Clindamycin 2)  * Metformin  Review of Systems Psych:  Denies suicidal thoughts/plans and thoughts of violence.  Physical Exam  General:   alert, well-developed, well-nourished, and well-hydrated.   Neck:  supple, full ROM, and no masses.   Lungs:  normal breath sounds and no wheezes.   Heart:  regular rhythm and no murmur.   Msk:  B knees have external changes of OA. LEFT knee lacks full extension by 10 degrees, lacks full flexion by 20 degrees. RIGHT knee has full extension and flexion. Boothe knees TTP medial joint line and left knee TTP lateral joint line. No effusion, no erythema. Mildly warm on left knee. [place] Bsoft Psych:  Oriented X3, memory intact for recent and remote, normally interactive, good eye contact, not anxious appearing, and not depressed appearing.     Impression & Recommendations:  Problem # 1:  OSTEOARTHRITIS (ICD-715.90)  Her updated medication list for this problem includes:    Vicodin 5-500 Mg Tabs (Hydrocodone-acetaminophen) .Marland Kitchen... 1-2 by mouth at bedtime as needed knee pain ros #60/month I will increase her vicodin dose. we talked abot getting her rescheduled for tkr--I can fill out her forms if she will fill out ROI so I can get her stress test results.  Orders: [facility]- Est  Level 4 (33295)  Problem # 2:  HYPERTENSION (ICD-401.9)  Her updated medication list for this problem includes:    Lotensin Hct 20-12.5 Mg Tabs (Benazepril-hydrochlorothiazide) .Marland Kitchen... 1 by mouth qd    Clonidine Hcl 0.1 Mg Tabs (Clonidine hcl) .Marland KitchenMarland KitchenMarland KitchenMarland Kitchen  1 by mouth bid pretty good control no med changes spoke very briefly about smoking cessation  Orders: [facility]- Est  Level 4 (16109)  Problem # 3:  SCHIZOPHRENIA (ICD-295.90)  seems stable at this time urged her to continue to follow w MH center, continue to avoid alcohol, family stressors.  Orders: [facility]- Est  Level 4 (99214)  Complete Medication List: 1)  Lotensin Hct 20-12.5 Mg Tabs (Benazepril-hydrochlorothiazide) .Marland Kitchen.. 1 by mouth qd 2)  Advair Diskus 500-50 Mcg/dose Misc (Fluticasone-salmeterol) .Marland Kitchen.. 1 puff two times a day 3)  Invega 9 Mg Xr24h-tab (Paliperidone) .... Per  psych 4)  Vicodin 5-500 Mg Tabs (Hydrocodone-acetaminophen) .Marland Kitchen.. 1-2 by mouth at bedtime as needed knee pain ros #60/month 5)  Clonidine Hcl 0.1 Mg Tabs (Clonidine hcl) .Marland Kitchen.. 1 by mouth bid 6)  Buspirone Hcl 10 Mg Tabs (Buspirone hcl) .... Per psych 7)  Benztropine Mesylate 2 Mg Tabs (Benztropine mesylate) .Marland Kitchen.. 1 by mouth once daily per psychiatry   Orders Added: 1)  [facility]- Est  Level 4 [60454]    Prevention & Chronic Care Immunizations   Influenza vaccine: given  (07/09/2008)   Influenza vaccine due: 07/09/2009    Tetanus booster: 12/26/2003: given   Tetanus booster due: 12/25/2013    Pneumococcal vaccine: Not documented  Colorectal Screening   Hemoccult: Not documented   Hemoccult due: Not Indicated    Colonoscopy: normal  (06/27/2007)   Colonoscopy due: 06/26/2017  Other Screening   Pap smear: Not documented   Pap smear action/deferral: Not indicated-other  (11/18/2009)    Mammogram: normal  (02/09/2007)   Mammogram due: 02/09/2008   Smoking status: current  (09/08/2010)   Smoking cessation counseling: yes  (11/12/2008)  Lipids   Total Cholesterol: Not documented   Lipid panel action/deferral: LDL Direct Ordered   LDL: Not documented   LDL Direct: 233  (03/17/2010)   HDL: Not documented   Triglycerides: Not documented    SGOT (AST): 15  (05/26/2010)   SGPT (ALT): 14  (05/26/2010)   Alkaline phosphatase: 81  (05/26/2010)   Total bilirubin: 0.2  (05/26/2010)    Lipid flowsheet reviewed?: Yes   Progress toward LDL goal: Unchanged  Hypertension   Last Blood Pressure: 150 / 93  (09/08/2010)   Serum creatinine: 0.71  (05/26/2010)   Serum potassium 4.4  (05/26/2010)    Hypertension flowsheet reviewed?: Yes   Progress toward BP goal: Unchanged  Self-Management Support :   Personal Goals (by the next clinic visit) :      Personal blood pressure goal: 140/90  (11/18/2009)   Hypertension self-management support: Written self-care plan  (11/18/2009)    Lipid  self-management support: Not documented

## 2011-02-11 ENCOUNTER — Other Ambulatory Visit: Payer: Self-pay | Admitting: Family Medicine

## 2011-05-24 ENCOUNTER — Telehealth: Payer: Self-pay | Admitting: *Deleted

## 2011-05-24 NOTE — Telephone Encounter (Signed)
Message copied by Farrell Ours on Tue May 24, 2011  5:23 PM ------      Message from: Denny Levy L      Created: Mon May 16, 2011  2:40 PM       Yes ok dbl book Ichelle      ----- Message -----         From: Erma Pinto         Sent: 05/12/2011   3:10 PM           To: Denny Levy, MD            Dr. Jennette Kettle,       Carissa Musick is coming in to see you on 8/29 @ 10:15am and he is wanting to know if his wife can be scheduled the same day because the next appointment available for you is not until 9/12. Question is to you can this patient Shanitra be double booked that day?      ----Huntley Dec

## 2011-05-25 ENCOUNTER — Ambulatory Visit (INDEPENDENT_AMBULATORY_CARE_PROVIDER_SITE_OTHER): Payer: Medicare Other | Admitting: Family Medicine

## 2011-05-25 VITALS — BP 130/89 | HR 79 | Temp 98.2°F | Wt 176.2 lb

## 2011-05-25 DIAGNOSIS — E785 Hyperlipidemia, unspecified: Secondary | ICD-10-CM

## 2011-05-25 DIAGNOSIS — Z96659 Presence of unspecified artificial knee joint: Secondary | ICD-10-CM

## 2011-05-25 DIAGNOSIS — R7301 Impaired fasting glucose: Secondary | ICD-10-CM

## 2011-05-25 DIAGNOSIS — I10 Essential (primary) hypertension: Secondary | ICD-10-CM

## 2011-05-25 MED ORDER — ALPRAZOLAM 1 MG PO TABS: ORAL_TABLET | ORAL | Status: DC

## 2011-05-26 LAB — COMPREHENSIVE METABOLIC PANEL
ALT: 12 U/L (ref 0–35)
AST: 16 U/L (ref 0–37)
Alkaline Phosphatase: 90 U/L (ref 39–117)
Creat: 0.78 mg/dL (ref 0.50–1.10)
Sodium: 141 mEq/L (ref 135–145)
Total Bilirubin: 0.3 mg/dL (ref 0.3–1.2)

## 2011-05-30 DIAGNOSIS — Z96659 Presence of unspecified artificial knee joint: Secondary | ICD-10-CM | POA: Insufficient documentation

## 2011-05-30 MED ORDER — FLUTICASONE PROPIONATE 50 MCG/ACT NA SUSP: 2.0000 | Freq: Every day | NASAL | Status: AC

## 2011-05-30 MED ORDER — SIMVASTATIN 40 MG PO TABS: 40.0000 mg | ORAL_TABLET | Freq: Every day | ORAL | Status: DC

## 2011-05-30 MED ORDER — GLIPIZIDE 5 MG PO TABS: ORAL_TABLET | ORAL | Status: DC

## 2011-05-30 NOTE — Progress Notes (Signed)
  Subjective:    Patient ID: Christine Hebert, female    DOB: [birth year], 55 y.o.   MRN: [id number]  HPI  #1. Followup hyperlipidemia. She had somehow gotten switched by Darl Pikes the hospital to Pravachol. She wants to go back to Zocor.  #2. 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 Randall An. She has completed rehabilitation and is doing really well. She still uses a cane for the other knee which is somewhat problematic but the left one is getting her essentially no pain.  #3. Followup diabetes mellitus. She was somewhat placed on glipizide, I think while she was in the hospital. She's not been taking it. He is occasionally checking her blood sugars in the use of less than 150. She has not branch sugars with her.  #4. Psychiatric. She is going to take a long car trip with her husband. Wants to do this but he is concerned about anxiety which he travels in a close space like a car. Wonders if I would give her a few Xanax for the trip. I will be in a car approximately 8-12 hours on 2 separate days. She continues followup with them [facility] and is currently stable on her Invega. She and her husband agree this is been a miracle medicine for her and she's been the most stable ever on that. Denies suicidal or homicidal ideation. Says her mood is stable. Is not using alcohol or using marijuana. She is continuing to smoke cigarettes but says she's trying to cut that down. She denies hallucinations or confusion.  Review of Systems    see history of present illness. Additionally no fever sweats or chills. No unexpected weight gain or loss. Objective:   Physical Exam   GENERALl: Well developed, well nourished, in no acute distress. NECK: Supple, FROM, without lymphadenopathy.  THYROID: normal without nodularity CAROTID ARTERIES: without bruits LUNGS: clear to auscultation bilaterally. No wheezes or rales. HEART: Regular rate and rhythm, no murmurs ABDOMEN: soft with positive bowel  sounds NEURO: No gross focal deficits EXT: Left knee has full extension and flexion. She is a very well-healed midline vertical scar. The calf is soft. The right knee has full flexion and lacks extension by about 5. She has an external changes of osteoarthritis of the knee with medial joint line tenderness. The calf is also soft. P SY CH: Alert and oriented x4 seems happy. Interactive. Asks answers questions appropriately. No agitation.      Assessment & Plan:  #1. Elevated blood sugar. We'll check her A1c. Right now she agrees to take  glipizide tablet per day and kidneys and blood sugars. She will followup in one to 2 months. #2. Hyperlipidemia. I think simvastatin would probably be a better medication for her anyway. I will, again. #3. Anxiety related to travel with family member. I will give her 15 tablets of 0.5 mg Xanax.  I will check some lab work today.

## 2011-06-06 ENCOUNTER — Encounter: Payer: Self-pay | Admitting: Family Medicine

## 2011-06-13 ENCOUNTER — Telehealth: Payer: Self-pay | Admitting: Family Medicine

## 2011-06-13 NOTE — Telephone Encounter (Signed)
Wants to stop Simvastatin b/c it is making her joints hurt.  pls advise

## 2011-06-15 MED ORDER — PRAVASTATIN SODIUM 40 MG PO TABS: 40.0000 mg | ORAL_TABLET | Freq: Every day | ORAL | Status: DC

## 2011-06-15 NOTE — Telephone Encounter (Signed)
Dear [place] Asters Team Ok to stop it--wait a month and then we need to try something different. I will call in a rx of pravachol to her pharmacy now--pick it  Up and start that in a month and then let me know THANKS!Denny Levy

## 2011-06-15 NOTE — Telephone Encounter (Signed)
Spoke with pt and gave her Dr. Donnetta Hail advice and informed her that she has called in another Rx and would like for her to start taking that and follow  Up in a month. Pt understood and agreed.Laureen Ochs, Viann Shove

## 2011-07-06 ENCOUNTER — Ambulatory Visit (INDEPENDENT_AMBULATORY_CARE_PROVIDER_SITE_OTHER): Payer: Medicare Other | Admitting: *Deleted

## 2011-07-06 VITALS — Temp 98.3°F

## 2011-07-06 DIAGNOSIS — Z23 Encounter for immunization: Secondary | ICD-10-CM

## 2011-07-06 MED ORDER — PNEUMOCOCCAL VAC POLYVALENT 25 MCG/0.5ML IJ INJ: 0.5000 mL | INJECTION | Freq: Once | INTRAMUSCULAR | Status: DC

## 2011-10-05 ENCOUNTER — Ambulatory Visit (INDEPENDENT_AMBULATORY_CARE_PROVIDER_SITE_OTHER): Payer: Medicare Other | Admitting: Family Medicine

## 2011-10-05 VITALS — BP 141/94 | HR 73 | Temp 97.4°F | Wt 193.0 lb

## 2011-10-05 DIAGNOSIS — I10 Essential (primary) hypertension: Secondary | ICD-10-CM

## 2011-10-05 DIAGNOSIS — F209 Schizophrenia, unspecified: Secondary | ICD-10-CM

## 2011-10-07 ENCOUNTER — Telehealth: Payer: Self-pay | Admitting: *Deleted

## 2011-10-07 NOTE — Progress Notes (Signed)
  Subjective:    Patient ID: Christine Hebert, female    DOB: October 04, 1955, 56 y.o.   MRN: [id number]  HPI  Followup hypertension doing quite well. No problems with medicines. No chest pain, no shortness of breath no headache. #2. Chronic knee pain. She had MRI done at her orthopedic office and has not heard back from them. She has some questions regarding total knee replacement. She had done well with the knee replacement on her other leg. #3. Schizophrenia: Right arm it is very stable and she is doing well. She continues to followup with the [facility].  Review of Systems    no fever, sweats, chills. No unusual weight gain or loss. Objective:   Physical Exam Vital signs reviewed. GENERAL: Well-developed, well-nourished, no acute distress. CARDIOVASCULAR: Regular rate and rhythm no murmur gallop or rub LUNGS: Clear to auscultation bilaterally, no rales or wheeze. ABDOMEN: Soft positive bowel sounds NEURO: No gross focal neurological deficits. MSK: Movement of extremity x 4.       Assessment & Plan:  #1. Hypertension doing well. No medication changes #2. Chronic knee pain, and we discussed the replacement. Her sugar contact her orthopedic physician's office. #3. Schizophrenia currently stable kidney chronic followup with the mental health center. Followup when necessary with me.

## 2011-10-07 NOTE — Telephone Encounter (Signed)
Patient called and informed us that they turned in script for Xanax at the pharmacy and because there was no date on script they would not be able to fill. I spoke with Dr. Jennette Kettle and it was ok to call pharmacy and give them the date of 10/05/2011 that script was written. Called pharmacy and confirmed this, script will be filled

## 2011-11-29 ENCOUNTER — Ambulatory Visit: Payer: Medicare Other | Admitting: Family Medicine

## 2011-12-28 ENCOUNTER — Ambulatory Visit
Admission: RE | Admit: 2011-12-28 | Discharge: 2011-12-28 | Disposition: A | Discharge status: Home / Self Care | Payer: Medicare Other | Source: Ambulatory Visit | Attending: Family Medicine | Admitting: Family Medicine

## 2011-12-28 ENCOUNTER — Ambulatory Visit: Payer: Medicare Other | Admitting: Family Medicine

## 2011-12-28 ENCOUNTER — Ambulatory Visit (INDEPENDENT_AMBULATORY_CARE_PROVIDER_SITE_OTHER): Payer: Medicare Other | Admitting: Family Medicine

## 2011-12-28 VITALS — BP 134/84 | HR 74 | Temp 97.5°F | Ht 63.0 in | Wt 191.7 lb

## 2011-12-28 DIAGNOSIS — I10 Essential (primary) hypertension: Secondary | ICD-10-CM

## 2011-12-28 DIAGNOSIS — R05 Cough: Secondary | ICD-10-CM

## 2011-12-28 DIAGNOSIS — R059 Cough, unspecified: Secondary | ICD-10-CM

## 2011-12-28 DIAGNOSIS — Z96659 Presence of unspecified artificial knee joint: Secondary | ICD-10-CM

## 2011-12-28 DIAGNOSIS — E785 Hyperlipidemia, unspecified: Secondary | ICD-10-CM

## 2011-12-28 DIAGNOSIS — R7301 Impaired fasting glucose: Secondary | ICD-10-CM

## 2011-12-28 LAB — LIPID PANEL
HDL: 41 mg/dL (ref >39)
LDL Cholesterol: 167 mg/dL — ABNORMAL HIGH (ref 0–99)
Total CHOL/HDL Ratio: 5.6 Ratio
VLDL: 23 mg/dL (ref 0–40)

## 2011-12-28 LAB — POCT GLYCOSYLATED HEMOGLOBIN (HGB A1C): Hemoglobin A1C: 7.2

## 2011-12-28 LAB — COMPREHENSIVE METABOLIC PANEL
ALT: 13 U/L (ref 0–35)
AST: 17 U/L (ref 0–37)
Alkaline Phosphatase: 78 U/L (ref 39–117)
Calcium: 10.1 mg/dL (ref 8.4–10.5)
Chloride: 101 mEq/L (ref 96–112)
Creat: 0.75 mg/dL (ref 0.50–1.10)

## 2011-12-28 MED ORDER — PREDNISONE 10 MG PO TABS: 20.0000 mg | ORAL_TABLET | Freq: Every day | ORAL | Status: DC

## 2011-12-28 MED ORDER — ALPRAZOLAM 1 MG PO TABS: ORAL_TABLET | ORAL | Status: DC

## 2011-12-28 NOTE — Patient Instructions (Signed)
Please stop and get a chest x ray. It was GREAT to see you!

## 2011-12-29 NOTE — Progress Notes (Signed)
  Subjective:    Patient ID: Christine Hebert, female    DOB: 09/09/1956, 56 y.o.   MRN: [id number]  HPI  Having increased cough, or wheezing. Is still smoking half to one whole pack per day. She's been trying to cut back. Cough productive of some greenish sputum. No fevers or chills. #2. Schizophrenia: Doing very well on her current medication regimen. She and her husband are due to long the best they have quite some time. #3. Osteoarthritis. Followed by orthopedics and she is status post unicompartmental left knee replacement. She wants to get the right one done but her orthopedist does not want to do it until she loses some weight and gets off cigarettes.  Review of Systems    denies fever, sweats, chills. She has had some weight gain. Objective:   Physical Exam  Vital signs reviewed. GENERAL: Well developed, well nourished, no acute distress LUNGS: Expiratory wheeze bilaterally at the base. Faint scattered crackles throughout. No accessory muscle use. CARDIOVASCULAR: Regular rate and rhythm no murmur gallop or rub PSYCH: Alert and oriented x4. Seems very at ease today and has good sense of humor. Interactive. Asks and answers questions appropriately. KNEES: She lacks full extension on the right back in degrees. The left knee has full extension and flexion. Calves bilaterally are soft.      Assessment & Plan:  #1. COPD/asthma exacerbation. Will treat with prednisone. I would like to get a chest x-ray also is has not had one for quite some time. #2. Severe osteoarthritis bilateral knees. I discussed with her what is the possibility of her stopping smoking anytime in the near future. #3. Schizophrenia: She is to be doing quite well on her current medication regimen. I will see her back in 3 months. I will get some lab work today his has not had in quite a while .

## 2011-12-30 ENCOUNTER — Telehealth: Payer: Self-pay | Admitting: Family Medicine

## 2011-12-30 ENCOUNTER — Encounter: Payer: Self-pay | Admitting: Family Medicine

## 2011-12-30 NOTE — Telephone Encounter (Signed)
Pt's husband called back wanting to know the results of pt's xray I read him the results. I told him that if he felt like he needed Dr. Jennette Kettle to call him back to speak with him about the results I would have her to call him. He was comfortable with the results. I also gave him the previous message from Dr. Johna Roles, Viann Shove

## 2011-12-30 NOTE — Telephone Encounter (Signed)
Dear Clif[place]on Asters Team If she is doing better then no bn THANKS! Denny Levy

## 2011-12-30 NOTE — Telephone Encounter (Signed)
Pt calls and states she cannot take prednisone as it raises her blood sugars and makes her aggressive. States she is not good about checking her sugars. Her # is [id number].

## 2012-06-06 ENCOUNTER — Ambulatory Visit: Payer: Medicare Other | Admitting: Family Medicine

## 2012-06-13 ENCOUNTER — Ambulatory Visit (INDEPENDENT_AMBULATORY_CARE_PROVIDER_SITE_OTHER): Payer: Medicare Other | Admitting: Family Medicine

## 2012-06-13 VITALS — BP 159/88 | HR 82 | Temp 97.9°F | Ht 63.0 in | Wt 184.6 lb

## 2012-06-13 DIAGNOSIS — M67919 Unspecified disorder of synovium and tendon, unspecified shoulder: Secondary | ICD-10-CM

## 2012-06-13 DIAGNOSIS — Z23 Encounter for immunization: Secondary | ICD-10-CM

## 2012-06-13 DIAGNOSIS — F209 Schizophrenia, unspecified: Secondary | ICD-10-CM

## 2012-06-13 DIAGNOSIS — M1990 Unspecified osteoarthritis, unspecified site: Secondary | ICD-10-CM

## 2012-06-13 DIAGNOSIS — M75100 Unspecified rotator cuff tear or rupture of unspecified shoulder, not specified as traumatic: Secondary | ICD-10-CM

## 2012-06-13 MED ORDER — OXYCODONE-ACETAMINOPHEN 5-325 MG PO TABS: ORAL_TABLET | ORAL | Status: DC

## 2012-06-14 NOTE — Progress Notes (Signed)
  Subjective:    Patient ID: Christine Hebert, female    DOB: Jan 28, 1956, 56 y.o.   MRN: [id number]  HPI  Right shoulder pain. Has been bothering her for quite a while. Pain with overhead and forward reaching. Pain is keeping her up at night. No numbness or tingling in her right hand. Arm does not feel weak. She is right-hand dominant.  #2. Schizophrenia. Was recently in the hospital for 6 days where they changed her from oral invega is 2 monthly injections of invega. #3. She is now seeing a new psychiatrist, Dr. Adella Hare at [facility] hospital. She's not sure he she likes him but is willing to give it a try. She feels like the last time and place her in psychiatric hospital. previous psychiatrist.  Review of Systems Denies unusual weight change. She's had no fever, sweats, chills. Please see history of present illness above for additional partner review of systems.    Objective:   Physical Exam Vital signs are reviewed. Notably her blood pressure is somewhat elevated today. GENERAL: Well-developed female no acute distress SHOULDER: Full range of motion although she has some pain with supraspinatus testing as well as with overhead extension. The rotator cuff muscles are intact and strength. Positive impingement signs and this reproduces her pain. INJECTION: Patient was given informed consent, signed copy in the chart. Appropriate time out was taken. Area prepped and draped in usual sterile fashion. 11 cc of methylprednisolone 40 mg/ml plus  one cc of 1% lidocaine without epinephrine was injected into the right subacromial bursa using a(n) posterior approach. The patient tolerated the procedure well. There were no complications. Post procedure instructions were given.        Assessment & Plan:

## 2012-08-09 ENCOUNTER — Ambulatory Visit (INDEPENDENT_AMBULATORY_CARE_PROVIDER_SITE_OTHER): Payer: Medicare Other | Admitting: Family Medicine

## 2012-08-09 ENCOUNTER — Encounter: Payer: Self-pay | Admitting: Family Medicine

## 2012-08-09 ENCOUNTER — Ambulatory Visit (HOSPITAL_COMMUNITY)
Admission: RE | Admit: 2012-08-09 | Discharge: 2012-08-09 | Disposition: A | Discharge status: Home / Self Care | Payer: Medicare Other | Source: Ambulatory Visit | Attending: Family Medicine | Admitting: Family Medicine

## 2012-08-09 VITALS — BP 130/78 | HR 76 | Temp 97.8°F | Ht 63.0 in | Wt 181.2 lb

## 2012-08-09 DIAGNOSIS — Z87891 Personal history of nicotine dependence: Secondary | ICD-10-CM

## 2012-08-09 DIAGNOSIS — Z9861 Coronary angioplasty status: Secondary | ICD-10-CM

## 2012-08-09 DIAGNOSIS — E785 Hyperlipidemia, unspecified: Secondary | ICD-10-CM

## 2012-08-09 DIAGNOSIS — R079 Chest pain, unspecified: Secondary | ICD-10-CM

## 2012-08-09 DIAGNOSIS — Z955 Presence of coronary angioplasty implant and graft: Secondary | ICD-10-CM | POA: Insufficient documentation

## 2012-08-09 DIAGNOSIS — I219 Acute myocardial infarction, unspecified: Secondary | ICD-10-CM | POA: Insufficient documentation

## 2012-08-09 NOTE — Progress Notes (Signed)
  Subjective:    Patient ID: Christine Hebert, female    DOB: 01/14/1956, 56 y.o.   MRN: [id number]  HPI  1, CHEST PAIN:  Had MI 2 weeks ago. Just wanted to f/u. Has had continued chest pain since d/c--it is different from her MI pain which was more severe, radiated to left neck, felt like "massive GERD". This pain is a dull ache, generalized anterior chest, no radiation. No change with position, exertion or food. She thinks it may be some anxiety. 2, TOBACCO:  Stopped smoking 3. MEDICATION ISSUES: concerned that plavix is "bad" for her, Not sure she wants to take it long term 4. Schizophrenia: continues on invega and doing OK. Plans trip to [place] to see her son--psychiatrist gave her some xanax for the plane ride. She is looking forward to it and also very nervous, 5. Scheduled to start cardiac rehab on retirn from [place]. Currently no halucinations or paranoia,  Review of Systems See hpi    Objective:   Physical Exam Vital signs reviewed GENERALl: Well developed, well nourished, in no acute distress. NECK: Supple, FROM, without lymphadenopathy.  THYROID: normal without nodularity CAROTID ARTERIES: without bruits LUNGS: clear to auscultation bilaterally. No wheezes or rales. HEART: Regular rate and rhythm, no murmurs ABDOMEN: soft with positive bowel sounds MSK: MOE x 4 SKIN no rash NEURO: no focal deficits   EKG NSR 69 bpm. Left axis. Inverted T waves  II, III, aVF.     Assessment & Plan:

## 2012-08-09 NOTE — Assessment & Plan Note (Signed)
Discussed absolute need for continuing plavix

## 2012-08-09 NOTE — Assessment & Plan Note (Signed)
After MI was placed n pravachol (either 40 or 80 mg--shes not sure)

## 2012-10-17 ENCOUNTER — Ambulatory Visit (INDEPENDENT_AMBULATORY_CARE_PROVIDER_SITE_OTHER): Payer: Medicare Other | Admitting: Family Medicine

## 2012-10-17 ENCOUNTER — Encounter: Payer: Self-pay | Admitting: Family Medicine

## 2012-10-17 VITALS — BP 118/67 | HR 68 | Temp 97.8°F | Wt 182.6 lb

## 2012-10-17 DIAGNOSIS — R7301 Impaired fasting glucose: Secondary | ICD-10-CM

## 2012-10-17 DIAGNOSIS — F411 Generalized anxiety disorder: Secondary | ICD-10-CM

## 2012-10-17 DIAGNOSIS — I219 Acute myocardial infarction, unspecified: Secondary | ICD-10-CM

## 2012-10-17 DIAGNOSIS — F419 Anxiety disorder, unspecified: Secondary | ICD-10-CM

## 2012-10-17 LAB — POCT GLYCOSYLATED HEMOGLOBIN (HGB A1C): Hemoglobin A1C: 7.2

## 2012-10-19 NOTE — Progress Notes (Signed)
  Subjective:    Patient ID: Christine Hebert, female    DOB: 13-Apr-1956, 57 y.o.   MRN: [id number]  HPI  Followup recent myocardial infarction. She still having chest pains but she thinks they're related to anxiety because they come on when she's in public most often. They're sharp. She feels anxious when they occur. She relates that she had no chest pain when she had her MI. On previous visit she, felt like a massive reflux attack. She does not recall this. She was seen by her cardiologist last week and she has an MR I scheduled for some time next week. She's otherwise doing well. She's been followed by a physician at [facility] the want to come talk to me about her anxiety. She also wants to me how well she's doing. She is stop smoking marijuana. She is down to 3 see her at her less a day of tobacco. She's not very any alcohol. She says she and her husband are getting along quite well.  Review of Systems Denies palpitations, no change in shortness of breath.Nolowerextremityedema.Nofever.    Objective:   Physical Exam Vital signs reviewed. GENERAL: Well-developed, well-nourished, no acute distress. CARDIOVASCULAR: Regular rate and rhythm no murmur gallop or rub LUNGS: Clear to auscultation bilaterally, no rales or wheeze. ABDOMEN: Soft positive bowel sounds NEURO: No gross focal neurological deficits. MSK: Movement of extremity x 4.         Assessment & Plan:  I had a long talk with her spending greater than 50% of our 40 minute office visit in counseling education regarding chest pain, anxiety, smoking cessation. I do think she's doing quite well. I have been following her for a long time and she seems to be doing as well as I have ever seen her. Encouraged her to continue with smoking cessation but she can't get below 3 cigarettes then I think that's way better than the 3 packs she was smoking. I will see her when necessary. She has regular followup with her other doctors including her  cardiologist.

## 2012-11-06 ENCOUNTER — Ambulatory Visit: Payer: Medicare Other

## 2012-11-23 IMAGING — CR DG CHEST 2V
2 series · 2 of 2 positions shown · non-contrast
Comparison: Chest x-ray of 05/01/2009

CLINICAL DATA: Cough, smoking history

CHEST - 2 VIEW

[w chest pa]
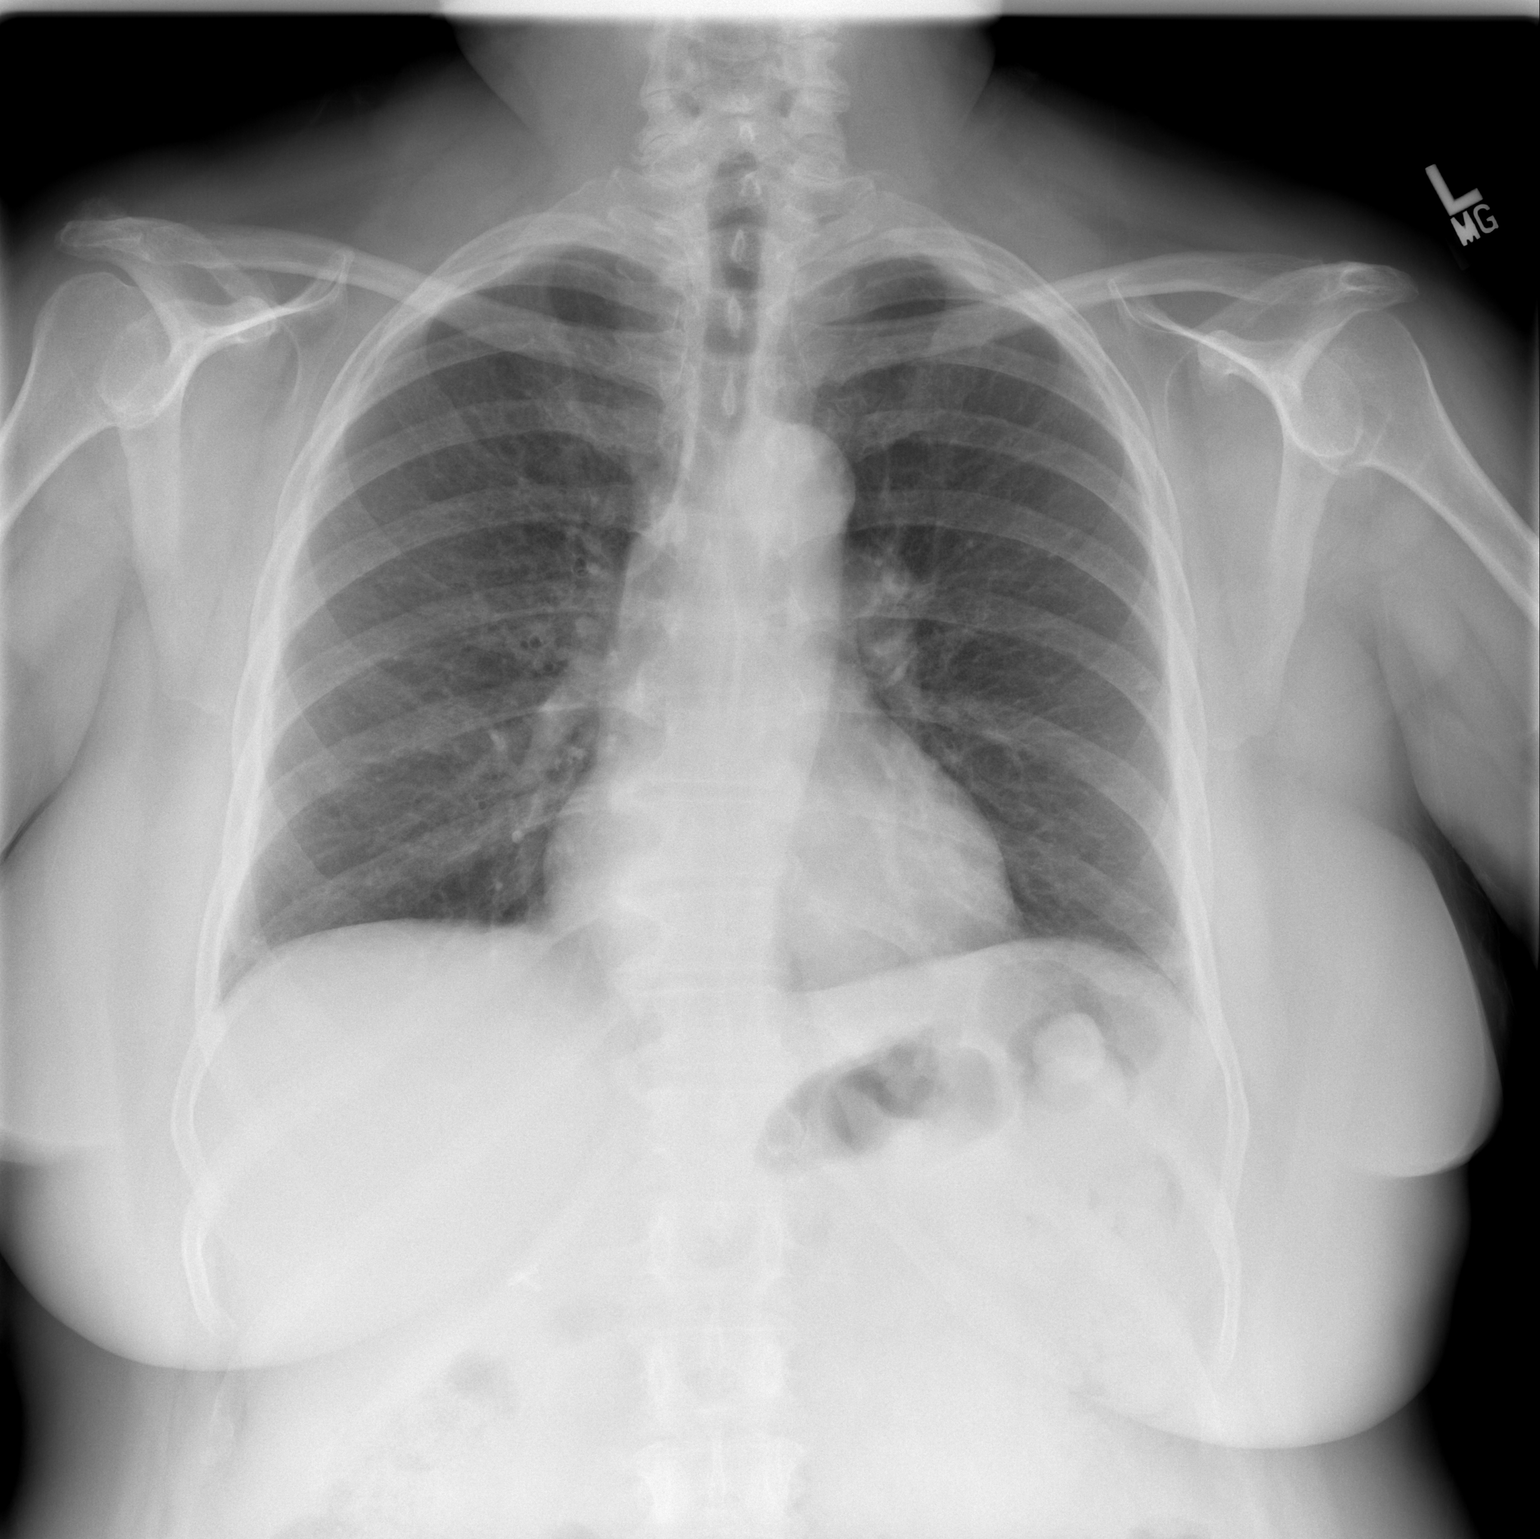

[w chest lat]
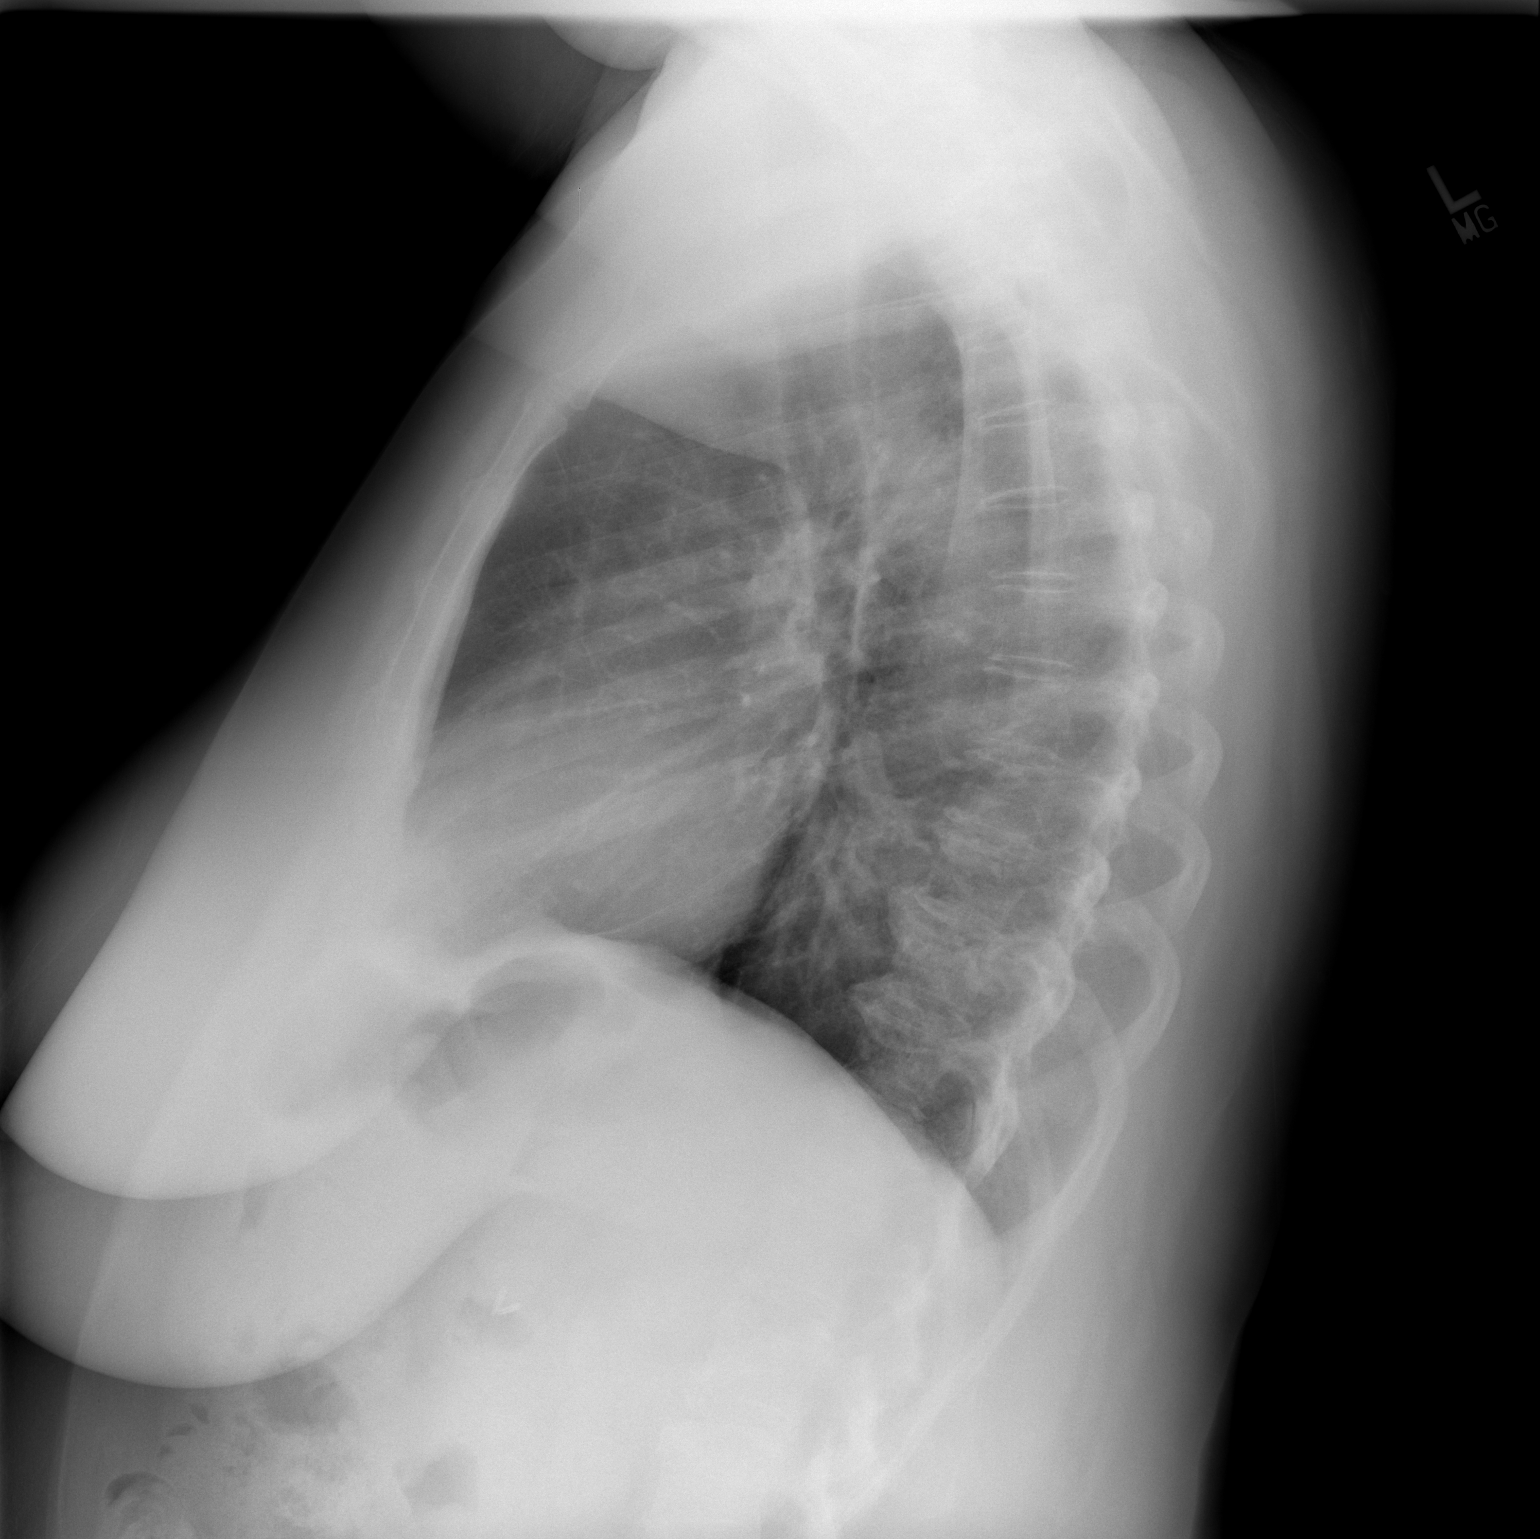

[2 of 2 positions shown; findings below may reference images not displayed]

FINDINGS: No active infiltrate or effusion is seen.  There is mild
peribronchial thickening present.  Mediastinal contours appear
normal.  The heart is within normal limits in size.
IMPRESSION: Mild peribronchial thickening.  No active lung disease.

## 2013-01-16 ENCOUNTER — Encounter: Payer: Self-pay | Admitting: Family Medicine

## 2013-01-16 ENCOUNTER — Ambulatory Visit (INDEPENDENT_AMBULATORY_CARE_PROVIDER_SITE_OTHER): Payer: Medicare Other | Admitting: Family Medicine

## 2013-01-16 VITALS — BP 123/72 | HR 76 | Temp 97.9°F | Ht 63.0 in | Wt 184.0 lb

## 2013-01-16 DIAGNOSIS — R7301 Impaired fasting glucose: Secondary | ICD-10-CM

## 2013-01-16 DIAGNOSIS — I10 Essential (primary) hypertension: Secondary | ICD-10-CM

## 2013-01-16 DIAGNOSIS — E119 Type 2 diabetes mellitus without complications: Secondary | ICD-10-CM

## 2013-01-16 DIAGNOSIS — I219 Acute myocardial infarction, unspecified: Secondary | ICD-10-CM

## 2013-01-16 MED ORDER — BUSPIRONE HCL 10 MG PO TABS: ORAL_TABLET | ORAL | Status: DC

## 2013-01-16 MED ORDER — CHLORTHALIDONE 25 MG PO TABS: ORAL_TABLET | ORAL | Status: AC

## 2013-01-16 MED ORDER — GLIPIZIDE 5 MG PO TABS: ORAL_TABLET | ORAL | Status: DC

## 2013-01-16 MED ORDER — DIAZEPAM 5 MG PO TABS: ORAL_TABLET | ORAL | Status: AC

## 2013-01-16 MED ORDER — AMOXICILLIN 500 MG PO CAPS: 500.0000 mg | ORAL_CAPSULE | Freq: Three times a day (TID) | ORAL | Status: DC

## 2013-01-16 MED ORDER — BENZTROPINE MESYLATE 2 MG PO TABS: ORAL_TABLET | ORAL | Status: AC

## 2013-01-17 NOTE — Assessment & Plan Note (Signed)
We discussed today how this has changed her life. A larger success it stopping marijuana and alcohol. She continues to be followed also by cardiology

## 2013-01-17 NOTE — Assessment & Plan Note (Signed)
Seems relatively stable. We'll make no medication changes.

## 2013-01-17 NOTE — Progress Notes (Signed)
  Subjective:    Patient ID: Christine Hebert, female    DOB: November 17, 1955, 57 y.o.   MRN: [id number]  HPI  Followup recent myocardial infarction. She's not had anymore episodes of chest pain. She is taking her blood pressure medicines appropriately. She's had no increased shortness of breath #2. Substance abuse: She is now marijuana and alcohol free for several months. She occasionally still smokes a cigarette. #3. Grief reaction: Sunday was anniversary of her son's death. This is the first year she has been marijuana and alcohol free and she said it was quite difficult for her but she is extremely proud of herself forgetting to this point  Review of Systems Denies fever, sweats, chills. See history of present illness above.    Objective:   Physical Exam  Vital signs are reviewed GENERAL: Well-developed female in no acute distress, walking with a cane. Progress or: Regular rate and rhythm Lungs: Clear to auscultation bilaterally with only an occasional expiratory wheeze. PSYCHIATRIC: Alert and oriented x4. Affect is interactive. Normal speech fluency and content.      Assessment & Plan:

## 2013-01-17 NOTE — Assessment & Plan Note (Signed)
A1c is 7.4 today. We discussed continuing diet and exercise modifications.

## 2013-06-19 ENCOUNTER — Ambulatory Visit: Payer: Medicare Other | Admitting: Family Medicine

## 2013-08-02 ENCOUNTER — Encounter: Payer: Self-pay | Admitting: Family Medicine

## 2013-08-02 ENCOUNTER — Ambulatory Visit (INDEPENDENT_AMBULATORY_CARE_PROVIDER_SITE_OTHER): Payer: Medicare Other | Admitting: Family Medicine

## 2013-08-02 VITALS — BP 123/83 | Ht 62.0 in | Wt 180.0 lb

## 2013-08-02 DIAGNOSIS — M7989 Other specified soft tissue disorders: Secondary | ICD-10-CM

## 2013-08-02 DIAGNOSIS — M799 Soft tissue disorder, unspecified: Secondary | ICD-10-CM

## 2013-08-05 ENCOUNTER — Encounter: Payer: Self-pay | Admitting: Family Medicine

## 2013-08-05 NOTE — Progress Notes (Signed)
  Subjective:    Patient ID: Christine Hebert, female    DOB: Sep 21, 1956, 57 y.o.   MRN: [id number]  HPI Right arm pain. Several soft tissue lumpy areas that are quite painful. She's not sure if this is totally new or foot there just now mildly tender. Some mild degenerative arthritis kind of problems with the right shoulder but that is unchanged. No numbness or tingling.   Review of Systems No fever, sweats, chills, unusual weight change. No arm heaviness.    Objective:   Physical Exam Vital signs are reviewed GENERAL: Well-developed female no acute distress SHOULDER: Right. Full range of motion in all planes the rotator cuff. She has some mild pain with supraspinatus testing her strength is intact.   ARM: Right. Upper. Along the posterior triceps area of the right upper arm f are 2 subcutaneous masses each about half to 3/4 cm in diameter.. These are mildly tender to palpation. They're fairly mobile. I do not feel matted to underneath tissue. There is no surrounding erythema and the skin around the area looks normal. The rest of the tricep muscle area is normal tricep muscle strength is normal and painless  EXHIBIT: None masses. No tenderness.      Assessment & Plan:  #1. Painful nodules in the arm. There is nothing in the exam when the shoulder exam is okay. I think these are probably from her Invega shots although she doesn't recall getting any in this arm. Alternatively they could be small lipomas. They're 2 and number. We'll follow these for right now. I gave her a flex to watch out for such as redness, worsening pain, arm swelling, fever sweats or chills. We'll follow her up for general or issues in 2-3 months and when necessary for the arm.

## 2013-12-09 ENCOUNTER — Telehealth: Payer: Self-pay | Admitting: *Deleted

## 2013-12-09 NOTE — Telephone Encounter (Signed)
LMVM for patient to please call.  Need to know if she has had a flu shot this year.  Gaudencio Chesnut, Loralyn Freshwater, [place]

## 2013-12-10 NOTE — Telephone Encounter (Signed)
Called again today and husband said, that the pt would call back today. Please see previous message. Javier Glazier, Gerrit Heck

## 2014-02-25 ENCOUNTER — Telehealth: Payer: Self-pay | Admitting: Family Medicine

## 2014-03-12 ENCOUNTER — Ambulatory Visit (INDEPENDENT_AMBULATORY_CARE_PROVIDER_SITE_OTHER): Payer: Medicare Other | Admitting: Family Medicine

## 2014-03-12 ENCOUNTER — Encounter: Payer: Self-pay | Admitting: Family Medicine

## 2014-03-12 VITALS — BP 131/75 | HR 84 | Temp 97.7°F | Ht 62.0 in | Wt 184.1 lb

## 2014-03-12 DIAGNOSIS — M1710 Unilateral primary osteoarthritis, unspecified knee: Secondary | ICD-10-CM

## 2014-03-12 DIAGNOSIS — M1711 Unilateral primary osteoarthritis, right knee: Secondary | ICD-10-CM

## 2014-03-12 MED ORDER — OXYCODONE-ACETAMINOPHEN 5-325 MG PO TABS: ORAL_TABLET | ORAL | Status: AC

## 2014-03-17 DIAGNOSIS — M1711 Unilateral primary osteoarthritis, right knee: Secondary | ICD-10-CM | POA: Insufficient documentation

## 2014-03-17 NOTE — Progress Notes (Signed)
   Subjective:    Patient ID: Christine Hebert, female    DOB: 1956/06/10, 58 y.o.   MRN: [id number]  HPI  Want to get an injection in her right knee. It's been really bothering her for the last month or so, stiffness, pain with any type of activity. No swelling. No red joint. #2. Continues to be abstinent from alcohol and marijuana. She's taking her psychiatric medicine regularly. Says things are going really well for her and her husband.  Review of Systems No fever, sweats, chills.    Objective:   Physical Exam Vital signs are reviewed GENERAL: Well-developed female no acute distress EXTREMITY: Right knee shows some external changes of synovitis but there is no erythema, no warmth, no effusion. She has full extension of the last 10 are fairly stiff. She has some crepitus. Ligamentously intact to varus and valgus stress. She's walking with a cane and antalgic gait. The calf is soft. The popliteal space is benign. She has some decrease in soft touch sensation bilaterally in her feet that is symmetrical.  INJECTION: Patient was given informed consent, signed copy in the chart. Appropriate time out was taken. Area prepped and draped in usual sterile fashion. One cc of methylprednisolone 40 mg/ml plus  4 cc of 1% lidocaine without epinephrine was injected into the right knee using a(n) anterior medial approach. The patient tolerated the procedure well. There were no complications. Post procedure instructions were given.        Assessment & Plan:

## 2014-03-19 NOTE — Telephone Encounter (Signed)
Called to setup appointment for AWV. When pt returns call please schedule 60 min appointment.

## 2014-08-06 ENCOUNTER — Ambulatory Visit (INDEPENDENT_AMBULATORY_CARE_PROVIDER_SITE_OTHER): Payer: Medicare Other | Admitting: Family Medicine

## 2014-08-06 ENCOUNTER — Encounter: Payer: Self-pay | Admitting: Family Medicine

## 2014-08-06 VITALS — BP 106/68 | HR 82 | Temp 98.3°F | Ht 62.0 in | Wt 183.9 lb

## 2014-08-06 DIAGNOSIS — E785 Hyperlipidemia, unspecified: Secondary | ICD-10-CM

## 2014-08-06 DIAGNOSIS — F209 Schizophrenia, unspecified: Secondary | ICD-10-CM

## 2014-08-06 DIAGNOSIS — M150 Primary generalized (osteo)arthritis: Secondary | ICD-10-CM

## 2014-08-06 DIAGNOSIS — M159 Polyosteoarthritis, unspecified: Secondary | ICD-10-CM

## 2014-08-06 NOTE — Patient Instructions (Signed)
I think it would be OK to decrease the crestor (rovustatin) to one tab every other day  i WOULD add the glipizide with the metformin as I think this will keep better control of your sugars. I am VERY PROUD of the progress you are making with smoking

## 2014-08-08 NOTE — Assessment & Plan Note (Signed)
Status post left TKR. She still walks with a cane but has had significant improvement in her pain level. She is using occasional hydrocodone maybe 1 or 2 tablets a day, not every day of the week.

## 2014-08-08 NOTE — Assessment & Plan Note (Signed)
Long discussion. I do think the Christine Hebert is working extremely well for her. I think the weight gain is partly related to age, decreased activity secondary to her osteoarthritis.

## 2014-08-08 NOTE — Progress Notes (Signed)
   Subjective:    Patient ID: Christine Hebert, female    DOB: 11-05-1955, 58 y.o.   MRN: [id number]  HPI Questions about diabetes management. She is followed by Dr. Collene Mares for that. They recently started her on glyburide and she has some questions. #2. She continues on [place] which she admits maintains her mood stability better than any other medicine has. She remains marijuana and alcohol free. She is concerned that she is starting to gain weight. #3. Marital stressors currently seen to be very stable. She and her husband are getting along better than they have in many years. She gives some of the credit to this to her cessation of alcohol and marijuana, some of it to [place]. #4. Hyperlipidemia. She had been switched to Crestor 20 mg a day and noticed that she was beginning to have myalgias. Has questions about the advisability of continuing.  PERTINENT  PMH / PSH: I have reviewed the patient's medications, allergies, past medical and surgical history. Pertinent findings that relate to today's visit / issues include: History of myocardial infarction, hypertension, hyperlipidemia. Long history of schizophrenia currently followed by they'll health system. Multiple areas of osteoarthritis status post  TKR.  Review of Systems Positive for weight gain. No urinary frequency, fever, sweats, chills. She does have increased myalgias particularly in the chest and arm  And thigh areas that are present most of the time, worse some days than others. This does seem temporally related to when she started on the Crestor.    Objective:   Physical Exam  Vital signs reviewed. GENERAL: Well-developed, well-nourished, no acute distress. CARDIOVASCULAR: Regular rate and rhythm no murmur gallop or rub LUNGS: Clear to auscultation bilaterally, no rales or wheeze. ABDOMEN: Soft positive bowel sounds NEURO: No gross focal neurological deficits. MSK: Movement of extremity x 4.antalgic gait walking with a  cane. PSYCHIATRIC: Alert and oriented 4. Affect is interactive. Speech is normal in content and fluency. No hallucination. No psychomotor retardation or agitation.        Assessment & Plan:

## 2014-08-08 NOTE — Assessment & Plan Note (Signed)
She was switched to high-dose Crestor and this seemed to coincide temporally with the advent of her myalgias. I think we will try decreasing her dose by taking one 20 mg tablet every other day for a while. I would rather do that and have her come off the medicine. She agrees.

## 2014-09-22 ENCOUNTER — Other Ambulatory Visit: Payer: Self-pay | Admitting: Family Medicine

## 2014-09-22 NOTE — Progress Notes (Signed)
Update her med list per the list she left with me at last ov Christine Hebert

## 2015-05-22 ENCOUNTER — Ambulatory Visit (INDEPENDENT_AMBULATORY_CARE_PROVIDER_SITE_OTHER): Payer: Self-pay | Admitting: Family Medicine

## 2015-05-22 VITALS — BP 126/82 | HR 83 | Temp 98.1°F | Wt 176.2 lb

## 2015-05-22 DIAGNOSIS — I10 Essential (primary) hypertension: Secondary | ICD-10-CM

## 2015-05-22 DIAGNOSIS — J449 Chronic obstructive pulmonary disease, unspecified: Secondary | ICD-10-CM

## 2015-05-22 DIAGNOSIS — R7301 Impaired fasting glucose: Secondary | ICD-10-CM

## 2015-05-22 DIAGNOSIS — E785 Hyperlipidemia, unspecified: Secondary | ICD-10-CM

## 2015-05-22 LAB — COMPREHENSIVE METABOLIC PANEL
ALBUMIN: 4.4 g/dL (ref 3.6–5.1)
ALK PHOS: 69 U/L (ref 33–130)
ALT: 12 U/L (ref 6–29)
AST: 15 U/L (ref 10–35)
BUN: 8 mg/dL (ref 7–25)
CALCIUM: 10.5 mg/dL — AB (ref 8.6–10.4)
CO2: 28 mmol/L (ref 20–31)
Chloride: 99 mmol/L (ref 98–110)
Creat: 0.77 mg/dL (ref 0.50–1.05)
Glucose, Bld: 63 mg/dL — ABNORMAL LOW (ref 65–99)
POTASSIUM: 3.9 mmol/L (ref 3.5–5.3)
Sodium: 139 mmol/L (ref 135–146)
TOTAL PROTEIN: 7.4 g/dL (ref 6.1–8.1)
Total Bilirubin: 0.3 mg/dL (ref 0.2–1.2)

## 2015-05-22 LAB — POCT GLYCOSYLATED HEMOGLOBIN (HGB A1C): HEMOGLOBIN A1C: 7

## 2015-05-22 LAB — LDL CHOLESTEROL, DIRECT: Direct LDL: 91 mg/dL (ref <130)

## 2015-05-22 MED ORDER — AZITHROMYCIN 250 MG PO TABS: ORAL_TABLET | ORAL | Status: AC

## 2015-05-22 NOTE — Progress Notes (Signed)
   Subjective:    Patient ID: Christine Hebert, female    DOB: 03/26/1956, 59 y.o.   MRN: [id number]  HPI Getting ready to go left eye noticed that she was having a little bit of increased "rattle" in her chest. Feels like she was does when she gets a COPD flare. Wants to go ahead and start on some anti-biotics. Has had no fever.  Otherwise doing extremely well. Her husband just had a hip replacement in she was in charge of everything in the house for the while he was recovering and that went well. Still smokes cigarettes but not smoking any marijuana, not drinking any beer, feeling extremely well. She has continued on her regular medications provided by the [facility] and is following up regularly with the therapist   Review of Systems See history of present illness. Additionally, cough that is slightly productive of tan sputum, mild increase in shortness of breath with exertion over the last 2-3 days.    Objective:   Physical Exam  Vital signs reviewed. GENERAL: Well-developed, well-nourished, no acute distress. CARDIOVASCULAR: Regular rate and rhythm no murmur gallop or rub LUNGS: F base reveals some crackles which cleared with cough. No wheeze. ABDOMEN: Soft positive bowel sounds NEURO: No gross focal neurological deficits. MSK: Movement of extremity x 4. Walking with a cane. Right knee some deformity of OA noticed.        Assessment & Plan:

## 2015-05-22 NOTE — Assessment & Plan Note (Signed)
Set her regular doctor has not checked her A1c in quite a while and she wants to get some blood work done today so we will do that and I will send her results in the mail

## 2015-05-22 NOTE — Assessment & Plan Note (Signed)
We'll treat her with azithromycin empirically for COPD exacerbation. She's not having any wheezing and has had no fever. She'll continue her other medications regularly.

## 2015-05-25 ENCOUNTER — Encounter: Payer: Self-pay | Admitting: Family Medicine

## 2015-11-03 ENCOUNTER — Telehealth: Payer: Self-pay | Admitting: *Deleted

## 2015-11-03 NOTE — Telephone Encounter (Signed)
Called patient to offer flu vaccine. Patient states she received flu vaccine at  [facility] in October of 2016. Added to historical immunization record. Velora Heckler, RN

## 2016-03-16 ENCOUNTER — Ambulatory Visit: Payer: Self-pay | Admitting: Family Medicine

## 2016-11-21 DIAGNOSIS — M19012 Primary osteoarthritis, left shoulder: Secondary | ICD-10-CM | POA: Diagnosis not present

## 2016-12-26 DIAGNOSIS — I10 Essential (primary) hypertension: Secondary | ICD-10-CM | POA: Diagnosis not present

## 2016-12-26 DIAGNOSIS — E118 Type 2 diabetes mellitus with unspecified complications: Secondary | ICD-10-CM | POA: Diagnosis not present

## 2016-12-26 DIAGNOSIS — Z87891 Personal history of nicotine dependence: Secondary | ICD-10-CM | POA: Diagnosis not present

## 2016-12-26 DIAGNOSIS — M1711 Unilateral primary osteoarthritis, right knee: Secondary | ICD-10-CM | POA: Diagnosis not present

## 2016-12-26 DIAGNOSIS — M461 Sacroiliitis, not elsewhere classified: Secondary | ICD-10-CM | POA: Diagnosis not present

## 2016-12-26 DIAGNOSIS — J42 Unspecified chronic bronchitis: Secondary | ICD-10-CM | POA: Diagnosis not present

## 2016-12-26 DIAGNOSIS — G8929 Other chronic pain: Secondary | ICD-10-CM | POA: Diagnosis not present

## 2016-12-26 DIAGNOSIS — J449 Chronic obstructive pulmonary disease, unspecified: Secondary | ICD-10-CM | POA: Diagnosis not present

## 2016-12-26 DIAGNOSIS — Z79899 Other long term (current) drug therapy: Secondary | ICD-10-CM | POA: Diagnosis not present

## 2016-12-26 DIAGNOSIS — I252 Old myocardial infarction: Secondary | ICD-10-CM | POA: Diagnosis not present

## 2016-12-26 DIAGNOSIS — E785 Hyperlipidemia, unspecified: Secondary | ICD-10-CM | POA: Diagnosis not present

## 2016-12-26 DIAGNOSIS — K219 Gastro-esophageal reflux disease without esophagitis: Secondary | ICD-10-CM | POA: Diagnosis not present

## 2017-01-23 DIAGNOSIS — Z79899 Other long term (current) drug therapy: Secondary | ICD-10-CM | POA: Diagnosis not present

## 2017-01-23 DIAGNOSIS — Z880 Allergy status to penicillin: Secondary | ICD-10-CM | POA: Diagnosis not present

## 2017-01-23 DIAGNOSIS — Z881 Allergy status to other antibiotic agents status: Secondary | ICD-10-CM | POA: Diagnosis not present

## 2017-01-23 DIAGNOSIS — Z886 Allergy status to analgesic agent status: Secondary | ICD-10-CM | POA: Diagnosis not present

## 2017-01-23 DIAGNOSIS — J449 Chronic obstructive pulmonary disease, unspecified: Secondary | ICD-10-CM | POA: Diagnosis not present

## 2017-01-23 DIAGNOSIS — E785 Hyperlipidemia, unspecified: Secondary | ICD-10-CM | POA: Diagnosis not present

## 2017-01-23 DIAGNOSIS — Z885 Allergy status to narcotic agent status: Secondary | ICD-10-CM | POA: Diagnosis not present

## 2017-01-23 DIAGNOSIS — I10 Essential (primary) hypertension: Secondary | ICD-10-CM | POA: Diagnosis not present

## 2017-01-23 DIAGNOSIS — Z87891 Personal history of nicotine dependence: Secondary | ICD-10-CM | POA: Diagnosis not present

## 2017-01-23 DIAGNOSIS — M62830 Muscle spasm of back: Secondary | ICD-10-CM | POA: Diagnosis not present

## 2017-01-23 DIAGNOSIS — I252 Old myocardial infarction: Secondary | ICD-10-CM | POA: Diagnosis not present

## 2017-01-23 DIAGNOSIS — E1140 Type 2 diabetes mellitus with diabetic neuropathy, unspecified: Secondary | ICD-10-CM | POA: Diagnosis not present

## 2017-01-23 DIAGNOSIS — Z7984 Long term (current) use of oral hypoglycemic drugs: Secondary | ICD-10-CM | POA: Diagnosis not present

## 2017-01-23 DIAGNOSIS — Z7982 Long term (current) use of aspirin: Secondary | ICD-10-CM | POA: Diagnosis not present

## 2017-01-23 DIAGNOSIS — Z7951 Long term (current) use of inhaled steroids: Secondary | ICD-10-CM | POA: Diagnosis not present

## 2017-01-23 DIAGNOSIS — Z888 Allergy status to other drugs, medicaments and biological substances status: Secondary | ICD-10-CM | POA: Diagnosis not present

## 2017-01-24 DIAGNOSIS — Z7982 Long term (current) use of aspirin: Secondary | ICD-10-CM | POA: Diagnosis not present

## 2017-01-24 DIAGNOSIS — I213 ST elevation (STEMI) myocardial infarction of unspecified site: Secondary | ICD-10-CM | POA: Diagnosis not present

## 2017-01-24 DIAGNOSIS — J45909 Unspecified asthma, uncomplicated: Secondary | ICD-10-CM | POA: Diagnosis not present

## 2017-01-24 DIAGNOSIS — Z955 Presence of coronary angioplasty implant and graft: Secondary | ICD-10-CM | POA: Diagnosis not present

## 2017-01-24 DIAGNOSIS — I252 Old myocardial infarction: Secondary | ICD-10-CM | POA: Diagnosis not present

## 2017-01-24 DIAGNOSIS — E785 Hyperlipidemia, unspecified: Secondary | ICD-10-CM | POA: Diagnosis not present

## 2017-01-24 DIAGNOSIS — E119 Type 2 diabetes mellitus without complications: Secondary | ICD-10-CM | POA: Diagnosis not present

## 2017-01-24 DIAGNOSIS — Z87891 Personal history of nicotine dependence: Secondary | ICD-10-CM | POA: Diagnosis not present

## 2017-01-24 DIAGNOSIS — Z7951 Long term (current) use of inhaled steroids: Secondary | ICD-10-CM | POA: Diagnosis not present

## 2017-01-24 DIAGNOSIS — Z79899 Other long term (current) drug therapy: Secondary | ICD-10-CM | POA: Diagnosis not present

## 2017-01-24 DIAGNOSIS — I259 Chronic ischemic heart disease, unspecified: Secondary | ICD-10-CM | POA: Diagnosis not present

## 2017-01-24 DIAGNOSIS — I10 Essential (primary) hypertension: Secondary | ICD-10-CM | POA: Diagnosis not present

## 2017-01-27 DIAGNOSIS — S46811A Strain of other muscles, fascia and tendons at shoulder and upper arm level, right arm, initial encounter: Secondary | ICD-10-CM | POA: Diagnosis not present

## 2017-02-10 DIAGNOSIS — Z87891 Personal history of nicotine dependence: Secondary | ICD-10-CM | POA: Diagnosis not present

## 2017-02-10 DIAGNOSIS — M1711 Unilateral primary osteoarthritis, right knee: Secondary | ICD-10-CM | POA: Diagnosis not present

## 2017-02-22 ENCOUNTER — Ambulatory Visit: Payer: Self-pay | Admitting: Family Medicine

## 2017-03-07 DIAGNOSIS — M1711 Unilateral primary osteoarthritis, right knee: Secondary | ICD-10-CM | POA: Diagnosis not present

## 2017-03-07 DIAGNOSIS — E119 Type 2 diabetes mellitus without complications: Secondary | ICD-10-CM | POA: Diagnosis not present

## 2017-03-09 DIAGNOSIS — Z87891 Personal history of nicotine dependence: Secondary | ICD-10-CM | POA: Diagnosis not present

## 2017-03-14 DIAGNOSIS — Z79899 Other long term (current) drug therapy: Secondary | ICD-10-CM | POA: Diagnosis not present

## 2017-03-14 DIAGNOSIS — Z96652 Presence of left artificial knee joint: Secondary | ICD-10-CM | POA: Diagnosis not present

## 2017-03-14 DIAGNOSIS — M1711 Unilateral primary osteoarthritis, right knee: Secondary | ICD-10-CM | POA: Diagnosis not present

## 2017-03-14 DIAGNOSIS — E118 Type 2 diabetes mellitus with unspecified complications: Secondary | ICD-10-CM | POA: Diagnosis not present

## 2017-03-16 DIAGNOSIS — H5203 Hypermetropia, bilateral: Secondary | ICD-10-CM | POA: Diagnosis not present

## 2017-03-16 DIAGNOSIS — H2513 Age-related nuclear cataract, bilateral: Secondary | ICD-10-CM | POA: Diagnosis not present

## 2017-03-16 DIAGNOSIS — H524 Presbyopia: Secondary | ICD-10-CM | POA: Diagnosis not present

## 2017-03-16 DIAGNOSIS — H52203 Unspecified astigmatism, bilateral: Secondary | ICD-10-CM | POA: Diagnosis not present

## 2017-03-16 DIAGNOSIS — E119 Type 2 diabetes mellitus without complications: Secondary | ICD-10-CM | POA: Diagnosis not present

## 2017-03-17 DIAGNOSIS — Z881 Allergy status to other antibiotic agents status: Secondary | ICD-10-CM | POA: Diagnosis not present

## 2017-03-17 DIAGNOSIS — Z888 Allergy status to other drugs, medicaments and biological substances status: Secondary | ICD-10-CM | POA: Diagnosis not present

## 2017-03-17 DIAGNOSIS — I219 Acute myocardial infarction, unspecified: Secondary | ICD-10-CM | POA: Diagnosis not present

## 2017-03-17 DIAGNOSIS — E118 Type 2 diabetes mellitus with unspecified complications: Secondary | ICD-10-CM | POA: Diagnosis not present

## 2017-03-17 DIAGNOSIS — Z880 Allergy status to penicillin: Secondary | ICD-10-CM | POA: Diagnosis not present

## 2017-03-17 DIAGNOSIS — J449 Chronic obstructive pulmonary disease, unspecified: Secondary | ICD-10-CM | POA: Diagnosis not present

## 2017-03-17 DIAGNOSIS — Z7982 Long term (current) use of aspirin: Secondary | ICD-10-CM | POA: Diagnosis not present

## 2017-03-17 DIAGNOSIS — Z79899 Other long term (current) drug therapy: Secondary | ICD-10-CM | POA: Diagnosis not present

## 2017-03-17 DIAGNOSIS — I10 Essential (primary) hypertension: Secondary | ICD-10-CM | POA: Diagnosis not present

## 2017-03-17 DIAGNOSIS — Z886 Allergy status to analgesic agent status: Secondary | ICD-10-CM | POA: Diagnosis not present

## 2017-03-17 DIAGNOSIS — Z882 Allergy status to sulfonamides status: Secondary | ICD-10-CM | POA: Diagnosis not present

## 2017-03-17 DIAGNOSIS — E785 Hyperlipidemia, unspecified: Secondary | ICD-10-CM | POA: Diagnosis not present

## 2017-03-17 DIAGNOSIS — I252 Old myocardial infarction: Secondary | ICD-10-CM | POA: Diagnosis not present

## 2017-03-17 DIAGNOSIS — Z87891 Personal history of nicotine dependence: Secondary | ICD-10-CM | POA: Diagnosis not present

## 2017-03-17 DIAGNOSIS — Z885 Allergy status to narcotic agent status: Secondary | ICD-10-CM | POA: Diagnosis not present

## 2017-03-17 DIAGNOSIS — Z7951 Long term (current) use of inhaled steroids: Secondary | ICD-10-CM | POA: Diagnosis not present

## 2017-03-17 DIAGNOSIS — R911 Solitary pulmonary nodule: Secondary | ICD-10-CM | POA: Diagnosis not present

## 2017-03-17 DIAGNOSIS — Z7984 Long term (current) use of oral hypoglycemic drugs: Secondary | ICD-10-CM | POA: Diagnosis not present

## 2017-03-23 DIAGNOSIS — Z7984 Long term (current) use of oral hypoglycemic drugs: Secondary | ICD-10-CM | POA: Diagnosis not present

## 2017-03-23 DIAGNOSIS — E119 Type 2 diabetes mellitus without complications: Secondary | ICD-10-CM | POA: Diagnosis not present

## 2017-03-30 DIAGNOSIS — I10 Essential (primary) hypertension: Secondary | ICD-10-CM | POA: Diagnosis not present

## 2017-03-30 DIAGNOSIS — E1142 Type 2 diabetes mellitus with diabetic polyneuropathy: Secondary | ICD-10-CM | POA: Diagnosis not present

## 2017-03-30 DIAGNOSIS — J45909 Unspecified asthma, uncomplicated: Secondary | ICD-10-CM | POA: Diagnosis not present

## 2017-03-30 DIAGNOSIS — I252 Old myocardial infarction: Secondary | ICD-10-CM | POA: Diagnosis not present

## 2017-03-30 DIAGNOSIS — Z713 Dietary counseling and surveillance: Secondary | ICD-10-CM | POA: Diagnosis not present

## 2017-03-30 DIAGNOSIS — J449 Chronic obstructive pulmonary disease, unspecified: Secondary | ICD-10-CM | POA: Diagnosis not present

## 2017-03-30 DIAGNOSIS — Z79899 Other long term (current) drug therapy: Secondary | ICD-10-CM | POA: Diagnosis not present

## 2017-03-30 DIAGNOSIS — E785 Hyperlipidemia, unspecified: Secondary | ICD-10-CM | POA: Diagnosis not present

## 2017-03-30 DIAGNOSIS — Z7984 Long term (current) use of oral hypoglycemic drugs: Secondary | ICD-10-CM | POA: Diagnosis not present

## 2017-04-04 DIAGNOSIS — Z713 Dietary counseling and surveillance: Secondary | ICD-10-CM | POA: Diagnosis not present

## 2017-04-04 DIAGNOSIS — E119 Type 2 diabetes mellitus without complications: Secondary | ICD-10-CM | POA: Diagnosis not present

## 2017-04-19 DIAGNOSIS — E118 Type 2 diabetes mellitus with unspecified complications: Secondary | ICD-10-CM | POA: Diagnosis not present

## 2017-04-19 DIAGNOSIS — J4520 Mild intermittent asthma, uncomplicated: Secondary | ICD-10-CM | POA: Diagnosis not present

## 2017-04-19 DIAGNOSIS — Z7189 Other specified counseling: Secondary | ICD-10-CM | POA: Diagnosis not present

## 2017-04-19 DIAGNOSIS — Z87891 Personal history of nicotine dependence: Secondary | ICD-10-CM | POA: Diagnosis not present

## 2017-04-19 DIAGNOSIS — Z7984 Long term (current) use of oral hypoglycemic drugs: Secondary | ICD-10-CM | POA: Diagnosis not present

## 2017-05-02 DIAGNOSIS — Z955 Presence of coronary angioplasty implant and graft: Secondary | ICD-10-CM | POA: Diagnosis not present

## 2017-05-02 DIAGNOSIS — Z7951 Long term (current) use of inhaled steroids: Secondary | ICD-10-CM | POA: Diagnosis not present

## 2017-05-02 DIAGNOSIS — E785 Hyperlipidemia, unspecified: Secondary | ICD-10-CM | POA: Diagnosis not present

## 2017-05-02 DIAGNOSIS — Z7984 Long term (current) use of oral hypoglycemic drugs: Secondary | ICD-10-CM | POA: Diagnosis not present

## 2017-05-02 DIAGNOSIS — J449 Chronic obstructive pulmonary disease, unspecified: Secondary | ICD-10-CM | POA: Diagnosis not present

## 2017-05-02 DIAGNOSIS — Z7982 Long term (current) use of aspirin: Secondary | ICD-10-CM | POA: Diagnosis not present

## 2017-05-02 DIAGNOSIS — B379 Candidiasis, unspecified: Secondary | ICD-10-CM | POA: Diagnosis not present

## 2017-05-02 DIAGNOSIS — Z79899 Other long term (current) drug therapy: Secondary | ICD-10-CM | POA: Diagnosis not present

## 2017-05-02 DIAGNOSIS — I252 Old myocardial infarction: Secondary | ICD-10-CM | POA: Diagnosis not present

## 2017-05-02 DIAGNOSIS — Z87891 Personal history of nicotine dependence: Secondary | ICD-10-CM | POA: Diagnosis not present

## 2017-05-02 DIAGNOSIS — M722 Plantar fascial fibromatosis: Secondary | ICD-10-CM | POA: Diagnosis not present

## 2017-05-02 DIAGNOSIS — I10 Essential (primary) hypertension: Secondary | ICD-10-CM | POA: Diagnosis not present

## 2017-05-02 DIAGNOSIS — E1140 Type 2 diabetes mellitus with diabetic neuropathy, unspecified: Secondary | ICD-10-CM | POA: Diagnosis not present

## 2017-05-03 DIAGNOSIS — Z1231 Encounter for screening mammogram for malignant neoplasm of breast: Secondary | ICD-10-CM | POA: Diagnosis not present

## 2017-05-04 DIAGNOSIS — Z7189 Other specified counseling: Secondary | ICD-10-CM | POA: Diagnosis not present

## 2017-05-04 DIAGNOSIS — E119 Type 2 diabetes mellitus without complications: Secondary | ICD-10-CM | POA: Diagnosis not present

## 2017-05-04 DIAGNOSIS — Z7984 Long term (current) use of oral hypoglycemic drugs: Secondary | ICD-10-CM | POA: Diagnosis not present

## 2017-05-08 DIAGNOSIS — M1711 Unilateral primary osteoarthritis, right knee: Secondary | ICD-10-CM | POA: Diagnosis not present

## 2017-05-19 DIAGNOSIS — M25532 Pain in left wrist: Secondary | ICD-10-CM | POA: Diagnosis not present

## 2017-05-19 DIAGNOSIS — M25512 Pain in left shoulder: Secondary | ICD-10-CM | POA: Diagnosis not present

## 2017-05-19 DIAGNOSIS — M170 Bilateral primary osteoarthritis of knee: Secondary | ICD-10-CM | POA: Diagnosis not present

## 2017-05-30 DIAGNOSIS — Z7984 Long term (current) use of oral hypoglycemic drugs: Secondary | ICD-10-CM | POA: Diagnosis not present

## 2017-05-30 DIAGNOSIS — Z886 Allergy status to analgesic agent status: Secondary | ICD-10-CM | POA: Diagnosis not present

## 2017-05-30 DIAGNOSIS — Z4789 Encounter for other orthopedic aftercare: Secondary | ICD-10-CM | POA: Diagnosis not present

## 2017-05-30 DIAGNOSIS — Z955 Presence of coronary angioplasty implant and graft: Secondary | ICD-10-CM | POA: Diagnosis not present

## 2017-05-30 DIAGNOSIS — Z881 Allergy status to other antibiotic agents status: Secondary | ICD-10-CM | POA: Diagnosis not present

## 2017-05-30 DIAGNOSIS — Z87891 Personal history of nicotine dependence: Secondary | ICD-10-CM | POA: Diagnosis not present

## 2017-05-30 DIAGNOSIS — S63502D Unspecified sprain of left wrist, subsequent encounter: Secondary | ICD-10-CM | POA: Diagnosis not present

## 2017-05-30 DIAGNOSIS — Z880 Allergy status to penicillin: Secondary | ICD-10-CM | POA: Diagnosis not present

## 2017-05-30 DIAGNOSIS — Z888 Allergy status to other drugs, medicaments and biological substances status: Secondary | ICD-10-CM | POA: Diagnosis not present

## 2017-05-30 DIAGNOSIS — E119 Type 2 diabetes mellitus without complications: Secondary | ICD-10-CM | POA: Diagnosis not present

## 2017-05-30 DIAGNOSIS — J449 Chronic obstructive pulmonary disease, unspecified: Secondary | ICD-10-CM | POA: Diagnosis not present

## 2017-05-30 DIAGNOSIS — I10 Essential (primary) hypertension: Secondary | ICD-10-CM | POA: Diagnosis not present

## 2017-05-30 DIAGNOSIS — Z882 Allergy status to sulfonamides status: Secondary | ICD-10-CM | POA: Diagnosis not present

## 2017-05-30 DIAGNOSIS — Z885 Allergy status to narcotic agent status: Secondary | ICD-10-CM | POA: Diagnosis not present

## 2017-05-30 DIAGNOSIS — E785 Hyperlipidemia, unspecified: Secondary | ICD-10-CM | POA: Diagnosis not present

## 2017-05-30 DIAGNOSIS — Z79899 Other long term (current) drug therapy: Secondary | ICD-10-CM | POA: Diagnosis not present

## 2017-05-30 DIAGNOSIS — Z7951 Long term (current) use of inhaled steroids: Secondary | ICD-10-CM | POA: Diagnosis not present

## 2017-05-30 DIAGNOSIS — M19032 Primary osteoarthritis, left wrist: Secondary | ICD-10-CM | POA: Diagnosis not present

## 2017-05-30 DIAGNOSIS — I252 Old myocardial infarction: Secondary | ICD-10-CM | POA: Diagnosis not present

## 2017-05-30 DIAGNOSIS — Z7982 Long term (current) use of aspirin: Secondary | ICD-10-CM | POA: Diagnosis not present

## 2017-05-31 DIAGNOSIS — E118 Type 2 diabetes mellitus with unspecified complications: Secondary | ICD-10-CM | POA: Diagnosis not present

## 2017-05-31 DIAGNOSIS — S63502D Unspecified sprain of left wrist, subsequent encounter: Secondary | ICD-10-CM | POA: Diagnosis not present

## 2017-05-31 DIAGNOSIS — E1142 Type 2 diabetes mellitus with diabetic polyneuropathy: Secondary | ICD-10-CM | POA: Diagnosis not present

## 2017-05-31 DIAGNOSIS — S6992XD Unspecified injury of left wrist, hand and finger(s), subsequent encounter: Secondary | ICD-10-CM | POA: Diagnosis not present

## 2017-05-31 DIAGNOSIS — E0842 Diabetes mellitus due to underlying condition with diabetic polyneuropathy: Secondary | ICD-10-CM | POA: Diagnosis not present

## 2017-05-31 DIAGNOSIS — W19XXXD Unspecified fall, subsequent encounter: Secondary | ICD-10-CM | POA: Diagnosis not present

## 2017-06-14 DIAGNOSIS — E1140 Type 2 diabetes mellitus with diabetic neuropathy, unspecified: Secondary | ICD-10-CM | POA: Diagnosis not present

## 2017-06-14 DIAGNOSIS — Z955 Presence of coronary angioplasty implant and graft: Secondary | ICD-10-CM | POA: Diagnosis not present

## 2017-06-14 DIAGNOSIS — Z881 Allergy status to other antibiotic agents status: Secondary | ICD-10-CM | POA: Diagnosis not present

## 2017-06-14 DIAGNOSIS — Z882 Allergy status to sulfonamides status: Secondary | ICD-10-CM | POA: Diagnosis not present

## 2017-06-14 DIAGNOSIS — Z888 Allergy status to other drugs, medicaments and biological substances status: Secondary | ICD-10-CM | POA: Diagnosis not present

## 2017-06-14 DIAGNOSIS — Z880 Allergy status to penicillin: Secondary | ICD-10-CM | POA: Diagnosis not present

## 2017-06-14 DIAGNOSIS — J449 Chronic obstructive pulmonary disease, unspecified: Secondary | ICD-10-CM | POA: Diagnosis not present

## 2017-06-14 DIAGNOSIS — Z7984 Long term (current) use of oral hypoglycemic drugs: Secondary | ICD-10-CM | POA: Diagnosis not present

## 2017-06-14 DIAGNOSIS — E785 Hyperlipidemia, unspecified: Secondary | ICD-10-CM | POA: Diagnosis not present

## 2017-06-14 DIAGNOSIS — M170 Bilateral primary osteoarthritis of knee: Secondary | ICD-10-CM | POA: Diagnosis not present

## 2017-06-14 DIAGNOSIS — Z87891 Personal history of nicotine dependence: Secondary | ICD-10-CM | POA: Diagnosis not present

## 2017-06-14 DIAGNOSIS — G8929 Other chronic pain: Secondary | ICD-10-CM | POA: Diagnosis not present

## 2017-06-14 DIAGNOSIS — Z885 Allergy status to narcotic agent status: Secondary | ICD-10-CM | POA: Diagnosis not present

## 2017-06-14 DIAGNOSIS — Z79899 Other long term (current) drug therapy: Secondary | ICD-10-CM | POA: Diagnosis not present

## 2017-06-14 DIAGNOSIS — Z886 Allergy status to analgesic agent status: Secondary | ICD-10-CM | POA: Diagnosis not present

## 2017-06-14 DIAGNOSIS — M25561 Pain in right knee: Secondary | ICD-10-CM | POA: Diagnosis not present

## 2017-06-14 DIAGNOSIS — Z7982 Long term (current) use of aspirin: Secondary | ICD-10-CM | POA: Diagnosis not present

## 2017-06-14 DIAGNOSIS — Z7951 Long term (current) use of inhaled steroids: Secondary | ICD-10-CM | POA: Diagnosis not present

## 2017-06-14 DIAGNOSIS — I252 Old myocardial infarction: Secondary | ICD-10-CM | POA: Diagnosis not present

## 2017-06-27 DIAGNOSIS — E118 Type 2 diabetes mellitus with unspecified complications: Secondary | ICD-10-CM | POA: Diagnosis not present

## 2017-06-28 DIAGNOSIS — W19XXXA Unspecified fall, initial encounter: Secondary | ICD-10-CM | POA: Diagnosis not present

## 2017-06-28 DIAGNOSIS — M170 Bilateral primary osteoarthritis of knee: Secondary | ICD-10-CM | POA: Diagnosis not present

## 2017-06-28 DIAGNOSIS — E0842 Diabetes mellitus due to underlying condition with diabetic polyneuropathy: Secondary | ICD-10-CM | POA: Diagnosis not present

## 2017-06-28 DIAGNOSIS — Z9889 Other specified postprocedural states: Secondary | ICD-10-CM | POA: Diagnosis not present

## 2017-08-03 DIAGNOSIS — Z0000 Encounter for general adult medical examination without abnormal findings: Secondary | ICD-10-CM | POA: Diagnosis not present

## 2017-08-03 DIAGNOSIS — Z23 Encounter for immunization: Secondary | ICD-10-CM | POA: Diagnosis not present

## 2017-08-10 DIAGNOSIS — G8929 Other chronic pain: Secondary | ICD-10-CM | POA: Diagnosis not present

## 2017-08-10 DIAGNOSIS — M1711 Unilateral primary osteoarthritis, right knee: Secondary | ICD-10-CM | POA: Diagnosis not present

## 2017-08-10 DIAGNOSIS — M25561 Pain in right knee: Secondary | ICD-10-CM | POA: Diagnosis not present

## 2017-08-22 DIAGNOSIS — E118 Type 2 diabetes mellitus with unspecified complications: Secondary | ICD-10-CM | POA: Diagnosis not present

## 2017-08-28 DIAGNOSIS — J441 Chronic obstructive pulmonary disease with (acute) exacerbation: Secondary | ICD-10-CM | POA: Diagnosis not present

## 2017-08-28 DIAGNOSIS — E118 Type 2 diabetes mellitus with unspecified complications: Secondary | ICD-10-CM | POA: Diagnosis not present

## 2017-09-11 DIAGNOSIS — M25561 Pain in right knee: Secondary | ICD-10-CM | POA: Diagnosis not present

## 2017-09-11 DIAGNOSIS — M170 Bilateral primary osteoarthritis of knee: Secondary | ICD-10-CM | POA: Diagnosis not present

## 2017-09-11 DIAGNOSIS — G8929 Other chronic pain: Secondary | ICD-10-CM | POA: Diagnosis not present

## 2017-09-15 DIAGNOSIS — R911 Solitary pulmonary nodule: Secondary | ICD-10-CM | POA: Diagnosis not present

## 2017-09-27 DIAGNOSIS — R531 Weakness: Secondary | ICD-10-CM | POA: Diagnosis not present

## 2017-09-27 DIAGNOSIS — H905 Unspecified sensorineural hearing loss: Secondary | ICD-10-CM | POA: Diagnosis not present

## 2017-09-27 DIAGNOSIS — H9313 Tinnitus, bilateral: Secondary | ICD-10-CM | POA: Diagnosis not present

## 2017-09-27 DIAGNOSIS — R51 Headache: Secondary | ICD-10-CM | POA: Diagnosis not present

## 2017-09-27 DIAGNOSIS — R259 Unspecified abnormal involuntary movements: Secondary | ICD-10-CM | POA: Diagnosis not present

## 2017-09-28 DIAGNOSIS — J449 Chronic obstructive pulmonary disease, unspecified: Secondary | ICD-10-CM | POA: Diagnosis not present

## 2017-09-28 DIAGNOSIS — M79661 Pain in right lower leg: Secondary | ICD-10-CM | POA: Diagnosis not present

## 2017-09-28 DIAGNOSIS — R42 Dizziness and giddiness: Secondary | ICD-10-CM | POA: Diagnosis not present

## 2017-09-28 DIAGNOSIS — I10 Essential (primary) hypertension: Secondary | ICD-10-CM | POA: Diagnosis not present

## 2017-09-28 DIAGNOSIS — E785 Hyperlipidemia, unspecified: Secondary | ICD-10-CM | POA: Diagnosis not present

## 2017-09-28 DIAGNOSIS — Z955 Presence of coronary angioplasty implant and graft: Secondary | ICD-10-CM | POA: Diagnosis not present

## 2017-09-28 DIAGNOSIS — Z9889 Other specified postprocedural states: Secondary | ICD-10-CM | POA: Diagnosis not present

## 2017-09-28 DIAGNOSIS — M79662 Pain in left lower leg: Secondary | ICD-10-CM | POA: Diagnosis not present

## 2017-09-28 DIAGNOSIS — E1142 Type 2 diabetes mellitus with diabetic polyneuropathy: Secondary | ICD-10-CM | POA: Diagnosis not present

## 2017-09-28 DIAGNOSIS — Z862 Personal history of diseases of the blood and blood-forming organs and certain disorders involving the immune mechanism: Secondary | ICD-10-CM | POA: Diagnosis not present

## 2017-09-28 DIAGNOSIS — I252 Old myocardial infarction: Secondary | ICD-10-CM | POA: Diagnosis not present

## 2017-09-28 DIAGNOSIS — Z87891 Personal history of nicotine dependence: Secondary | ICD-10-CM | POA: Diagnosis not present

## 2017-09-28 DIAGNOSIS — R51 Headache: Secondary | ICD-10-CM | POA: Diagnosis not present

## 2017-10-02 DIAGNOSIS — Z862 Personal history of diseases of the blood and blood-forming organs and certain disorders involving the immune mechanism: Secondary | ICD-10-CM | POA: Diagnosis not present

## 2017-10-02 DIAGNOSIS — R7989 Other specified abnormal findings of blood chemistry: Secondary | ICD-10-CM | POA: Diagnosis not present

## 2017-10-02 DIAGNOSIS — E1165 Type 2 diabetes mellitus with hyperglycemia: Secondary | ICD-10-CM | POA: Diagnosis not present

## 2017-10-02 DIAGNOSIS — R5382 Chronic fatigue, unspecified: Secondary | ICD-10-CM | POA: Diagnosis not present

## 2017-10-03 DIAGNOSIS — E118 Type 2 diabetes mellitus with unspecified complications: Secondary | ICD-10-CM | POA: Diagnosis not present

## 2017-10-17 DIAGNOSIS — E118 Type 2 diabetes mellitus with unspecified complications: Secondary | ICD-10-CM | POA: Diagnosis not present

## 2017-10-17 DIAGNOSIS — R5382 Chronic fatigue, unspecified: Secondary | ICD-10-CM | POA: Diagnosis not present

## 2017-10-17 DIAGNOSIS — R7989 Other specified abnormal findings of blood chemistry: Secondary | ICD-10-CM | POA: Diagnosis not present

## 2017-10-17 DIAGNOSIS — Z720 Tobacco use: Secondary | ICD-10-CM | POA: Diagnosis not present

## 2017-10-31 DIAGNOSIS — E162 Hypoglycemia, unspecified: Secondary | ICD-10-CM | POA: Diagnosis not present

## 2017-10-31 DIAGNOSIS — E118 Type 2 diabetes mellitus with unspecified complications: Secondary | ICD-10-CM | POA: Diagnosis not present

## 2017-11-09 DIAGNOSIS — R946 Abnormal results of thyroid function studies: Secondary | ICD-10-CM | POA: Diagnosis not present

## 2017-11-09 DIAGNOSIS — J42 Unspecified chronic bronchitis: Secondary | ICD-10-CM | POA: Diagnosis not present

## 2017-11-09 DIAGNOSIS — E0842 Diabetes mellitus due to underlying condition with diabetic polyneuropathy: Secondary | ICD-10-CM | POA: Diagnosis not present

## 2017-11-09 DIAGNOSIS — K921 Melena: Secondary | ICD-10-CM | POA: Diagnosis not present

## 2017-11-09 DIAGNOSIS — E118 Type 2 diabetes mellitus with unspecified complications: Secondary | ICD-10-CM | POA: Diagnosis not present

## 2017-11-09 DIAGNOSIS — J4520 Mild intermittent asthma, uncomplicated: Secondary | ICD-10-CM | POA: Diagnosis not present

## 2017-11-09 DIAGNOSIS — I10 Essential (primary) hypertension: Secondary | ICD-10-CM | POA: Diagnosis not present

## 2017-11-21 DIAGNOSIS — E118 Type 2 diabetes mellitus with unspecified complications: Secondary | ICD-10-CM | POA: Diagnosis not present

## 2017-11-29 DIAGNOSIS — E118 Type 2 diabetes mellitus with unspecified complications: Secondary | ICD-10-CM | POA: Diagnosis not present

## 2017-11-29 DIAGNOSIS — E785 Hyperlipidemia, unspecified: Secondary | ICD-10-CM | POA: Diagnosis not present

## 2017-11-29 DIAGNOSIS — I259 Chronic ischemic heart disease, unspecified: Secondary | ICD-10-CM | POA: Diagnosis not present

## 2017-11-29 DIAGNOSIS — I252 Old myocardial infarction: Secondary | ICD-10-CM | POA: Diagnosis not present

## 2017-11-29 DIAGNOSIS — R079 Chest pain, unspecified: Secondary | ICD-10-CM | POA: Diagnosis not present

## 2017-12-01 DIAGNOSIS — Z87891 Personal history of nicotine dependence: Secondary | ICD-10-CM | POA: Diagnosis not present

## 2017-12-01 DIAGNOSIS — J42 Unspecified chronic bronchitis: Secondary | ICD-10-CM | POA: Diagnosis not present

## 2017-12-01 DIAGNOSIS — J4520 Mild intermittent asthma, uncomplicated: Secondary | ICD-10-CM | POA: Diagnosis not present

## 2017-12-05 DIAGNOSIS — E118 Type 2 diabetes mellitus with unspecified complications: Secondary | ICD-10-CM | POA: Diagnosis not present

## 2017-12-15 DIAGNOSIS — G4730 Sleep apnea, unspecified: Secondary | ICD-10-CM | POA: Diagnosis not present

## 2017-12-15 DIAGNOSIS — I10 Essential (primary) hypertension: Secondary | ICD-10-CM | POA: Diagnosis not present

## 2017-12-15 DIAGNOSIS — R0683 Snoring: Secondary | ICD-10-CM | POA: Diagnosis not present

## 2017-12-15 DIAGNOSIS — Z9189 Other specified personal risk factors, not elsewhere classified: Secondary | ICD-10-CM | POA: Diagnosis not present

## 2017-12-15 DIAGNOSIS — G4700 Insomnia, unspecified: Secondary | ICD-10-CM | POA: Diagnosis not present

## 2017-12-18 DIAGNOSIS — E119 Type 2 diabetes mellitus without complications: Secondary | ICD-10-CM | POA: Diagnosis not present

## 2017-12-18 DIAGNOSIS — R29818 Other symptoms and signs involving the nervous system: Secondary | ICD-10-CM | POA: Diagnosis not present

## 2017-12-18 DIAGNOSIS — Z87891 Personal history of nicotine dependence: Secondary | ICD-10-CM | POA: Diagnosis not present

## 2017-12-18 DIAGNOSIS — I272 Pulmonary hypertension, unspecified: Secondary | ICD-10-CM | POA: Diagnosis not present

## 2017-12-18 DIAGNOSIS — I252 Old myocardial infarction: Secondary | ICD-10-CM | POA: Diagnosis not present

## 2017-12-18 DIAGNOSIS — Z7984 Long term (current) use of oral hypoglycemic drugs: Secondary | ICD-10-CM | POA: Diagnosis not present

## 2017-12-18 DIAGNOSIS — J449 Chronic obstructive pulmonary disease, unspecified: Secondary | ICD-10-CM | POA: Diagnosis not present

## 2017-12-18 DIAGNOSIS — J42 Unspecified chronic bronchitis: Secondary | ICD-10-CM | POA: Diagnosis not present

## 2017-12-18 DIAGNOSIS — I10 Essential (primary) hypertension: Secondary | ICD-10-CM | POA: Diagnosis not present

## 2017-12-18 DIAGNOSIS — I259 Chronic ischemic heart disease, unspecified: Secondary | ICD-10-CM | POA: Diagnosis not present

## 2017-12-18 DIAGNOSIS — G478 Other sleep disorders: Secondary | ICD-10-CM | POA: Diagnosis not present

## 2017-12-18 DIAGNOSIS — R0683 Snoring: Secondary | ICD-10-CM | POA: Diagnosis not present

## 2017-12-25 DIAGNOSIS — I10 Essential (primary) hypertension: Secondary | ICD-10-CM | POA: Diagnosis not present

## 2017-12-25 DIAGNOSIS — E785 Hyperlipidemia, unspecified: Secondary | ICD-10-CM | POA: Diagnosis not present

## 2017-12-25 DIAGNOSIS — R0789 Other chest pain: Secondary | ICD-10-CM | POA: Diagnosis not present

## 2017-12-25 DIAGNOSIS — I252 Old myocardial infarction: Secondary | ICD-10-CM | POA: Diagnosis not present

## 2017-12-25 DIAGNOSIS — I259 Chronic ischemic heart disease, unspecified: Secondary | ICD-10-CM | POA: Diagnosis not present

## 2017-12-29 DIAGNOSIS — K921 Melena: Secondary | ICD-10-CM | POA: Diagnosis not present

## 2017-12-29 DIAGNOSIS — I2510 Atherosclerotic heart disease of native coronary artery without angina pectoris: Secondary | ICD-10-CM | POA: Diagnosis not present

## 2017-12-29 DIAGNOSIS — K5730 Diverticulosis of large intestine without perforation or abscess without bleeding: Secondary | ICD-10-CM | POA: Diagnosis not present

## 2017-12-29 DIAGNOSIS — Z87891 Personal history of nicotine dependence: Secondary | ICD-10-CM | POA: Diagnosis not present

## 2017-12-29 DIAGNOSIS — I252 Old myocardial infarction: Secondary | ICD-10-CM | POA: Diagnosis not present

## 2017-12-29 DIAGNOSIS — I10 Essential (primary) hypertension: Secondary | ICD-10-CM | POA: Diagnosis not present

## 2017-12-29 DIAGNOSIS — E1140 Type 2 diabetes mellitus with diabetic neuropathy, unspecified: Secondary | ICD-10-CM | POA: Diagnosis not present

## 2017-12-29 DIAGNOSIS — I272 Pulmonary hypertension, unspecified: Secondary | ICD-10-CM | POA: Diagnosis not present

## 2017-12-29 DIAGNOSIS — K2960 Other gastritis without bleeding: Secondary | ICD-10-CM | POA: Diagnosis not present

## 2017-12-29 DIAGNOSIS — Z7984 Long term (current) use of oral hypoglycemic drugs: Secondary | ICD-10-CM | POA: Diagnosis not present

## 2017-12-29 DIAGNOSIS — E785 Hyperlipidemia, unspecified: Secondary | ICD-10-CM | POA: Diagnosis not present

## 2017-12-29 DIAGNOSIS — K3189 Other diseases of stomach and duodenum: Secondary | ICD-10-CM | POA: Diagnosis not present

## 2017-12-29 DIAGNOSIS — K219 Gastro-esophageal reflux disease without esophagitis: Secondary | ICD-10-CM | POA: Diagnosis not present

## 2017-12-29 DIAGNOSIS — K2970 Gastritis, unspecified, without bleeding: Secondary | ICD-10-CM | POA: Diagnosis not present

## 2017-12-29 DIAGNOSIS — J449 Chronic obstructive pulmonary disease, unspecified: Secondary | ICD-10-CM | POA: Diagnosis not present

## 2018-01-03 DIAGNOSIS — I10 Essential (primary) hypertension: Secondary | ICD-10-CM | POA: Diagnosis not present

## 2018-01-03 DIAGNOSIS — M1711 Unilateral primary osteoarthritis, right knee: Secondary | ICD-10-CM | POA: Diagnosis not present

## 2018-01-03 DIAGNOSIS — E1169 Type 2 diabetes mellitus with other specified complication: Secondary | ICD-10-CM | POA: Diagnosis not present

## 2018-01-18 DIAGNOSIS — M25551 Pain in right hip: Secondary | ICD-10-CM | POA: Diagnosis not present

## 2018-01-18 DIAGNOSIS — M545 Low back pain: Secondary | ICD-10-CM | POA: Diagnosis not present

## 2018-01-29 DIAGNOSIS — M1711 Unilateral primary osteoarthritis, right knee: Secondary | ICD-10-CM | POA: Diagnosis not present

## 2018-01-29 DIAGNOSIS — E1140 Type 2 diabetes mellitus with diabetic neuropathy, unspecified: Secondary | ICD-10-CM | POA: Diagnosis not present

## 2018-01-29 DIAGNOSIS — H2510 Age-related nuclear cataract, unspecified eye: Secondary | ICD-10-CM | POA: Diagnosis not present

## 2018-01-29 DIAGNOSIS — J449 Chronic obstructive pulmonary disease, unspecified: Secondary | ICD-10-CM | POA: Diagnosis not present

## 2018-01-29 DIAGNOSIS — M19012 Primary osteoarthritis, left shoulder: Secondary | ICD-10-CM | POA: Diagnosis not present

## 2018-01-29 DIAGNOSIS — H52203 Unspecified astigmatism, bilateral: Secondary | ICD-10-CM | POA: Diagnosis not present

## 2018-01-29 DIAGNOSIS — I10 Essential (primary) hypertension: Secondary | ICD-10-CM | POA: Diagnosis not present

## 2018-01-29 DIAGNOSIS — Q762 Congenital spondylolisthesis: Secondary | ICD-10-CM | POA: Diagnosis not present

## 2018-01-29 DIAGNOSIS — H5212 Myopia, left eye: Secondary | ICD-10-CM | POA: Diagnosis not present

## 2018-01-29 DIAGNOSIS — G8929 Other chronic pain: Secondary | ICD-10-CM | POA: Diagnosis not present

## 2018-01-29 DIAGNOSIS — H53002 Unspecified amblyopia, left eye: Secondary | ICD-10-CM | POA: Diagnosis not present

## 2018-01-30 DIAGNOSIS — E1140 Type 2 diabetes mellitus with diabetic neuropathy, unspecified: Secondary | ICD-10-CM | POA: Diagnosis not present

## 2018-01-30 DIAGNOSIS — Q762 Congenital spondylolisthesis: Secondary | ICD-10-CM | POA: Diagnosis not present

## 2018-01-30 DIAGNOSIS — I10 Essential (primary) hypertension: Secondary | ICD-10-CM | POA: Diagnosis not present

## 2018-01-30 DIAGNOSIS — H52203 Unspecified astigmatism, bilateral: Secondary | ICD-10-CM | POA: Diagnosis not present

## 2018-01-30 DIAGNOSIS — I252 Old myocardial infarction: Secondary | ICD-10-CM | POA: Diagnosis not present

## 2018-01-30 DIAGNOSIS — I259 Chronic ischemic heart disease, unspecified: Secondary | ICD-10-CM | POA: Diagnosis not present

## 2018-01-30 DIAGNOSIS — J449 Chronic obstructive pulmonary disease, unspecified: Secondary | ICD-10-CM | POA: Diagnosis not present

## 2018-01-30 DIAGNOSIS — M1711 Unilateral primary osteoarthritis, right knee: Secondary | ICD-10-CM | POA: Diagnosis not present

## 2018-01-30 DIAGNOSIS — H53002 Unspecified amblyopia, left eye: Secondary | ICD-10-CM | POA: Diagnosis not present

## 2018-01-30 DIAGNOSIS — M19012 Primary osteoarthritis, left shoulder: Secondary | ICD-10-CM | POA: Diagnosis not present

## 2018-01-30 DIAGNOSIS — H5212 Myopia, left eye: Secondary | ICD-10-CM | POA: Diagnosis not present

## 2018-01-30 DIAGNOSIS — G8929 Other chronic pain: Secondary | ICD-10-CM | POA: Diagnosis not present

## 2018-01-30 DIAGNOSIS — E785 Hyperlipidemia, unspecified: Secondary | ICD-10-CM | POA: Diagnosis not present

## 2018-01-30 DIAGNOSIS — H2510 Age-related nuclear cataract, unspecified eye: Secondary | ICD-10-CM | POA: Diagnosis not present

## 2018-01-30 DIAGNOSIS — Z955 Presence of coronary angioplasty implant and graft: Secondary | ICD-10-CM | POA: Diagnosis not present

## 2018-02-01 DIAGNOSIS — G8929 Other chronic pain: Secondary | ICD-10-CM | POA: Diagnosis not present

## 2018-02-01 DIAGNOSIS — I10 Essential (primary) hypertension: Secondary | ICD-10-CM | POA: Diagnosis not present

## 2018-02-01 DIAGNOSIS — Q762 Congenital spondylolisthesis: Secondary | ICD-10-CM | POA: Diagnosis not present

## 2018-02-01 DIAGNOSIS — H2510 Age-related nuclear cataract, unspecified eye: Secondary | ICD-10-CM | POA: Diagnosis not present

## 2018-02-01 DIAGNOSIS — M1711 Unilateral primary osteoarthritis, right knee: Secondary | ICD-10-CM | POA: Diagnosis not present

## 2018-02-01 DIAGNOSIS — H53002 Unspecified amblyopia, left eye: Secondary | ICD-10-CM | POA: Diagnosis not present

## 2018-02-01 DIAGNOSIS — M19012 Primary osteoarthritis, left shoulder: Secondary | ICD-10-CM | POA: Diagnosis not present

## 2018-02-01 DIAGNOSIS — E1140 Type 2 diabetes mellitus with diabetic neuropathy, unspecified: Secondary | ICD-10-CM | POA: Diagnosis not present

## 2018-02-01 DIAGNOSIS — H5212 Myopia, left eye: Secondary | ICD-10-CM | POA: Diagnosis not present

## 2018-02-01 DIAGNOSIS — H52203 Unspecified astigmatism, bilateral: Secondary | ICD-10-CM | POA: Diagnosis not present

## 2018-02-01 DIAGNOSIS — J449 Chronic obstructive pulmonary disease, unspecified: Secondary | ICD-10-CM | POA: Diagnosis not present

## 2018-02-05 DIAGNOSIS — I10 Essential (primary) hypertension: Secondary | ICD-10-CM | POA: Diagnosis not present

## 2018-02-05 DIAGNOSIS — J449 Chronic obstructive pulmonary disease, unspecified: Secondary | ICD-10-CM | POA: Diagnosis not present

## 2018-02-05 DIAGNOSIS — H2510 Age-related nuclear cataract, unspecified eye: Secondary | ICD-10-CM | POA: Diagnosis not present

## 2018-02-05 DIAGNOSIS — Q762 Congenital spondylolisthesis: Secondary | ICD-10-CM | POA: Diagnosis not present

## 2018-02-05 DIAGNOSIS — H5212 Myopia, left eye: Secondary | ICD-10-CM | POA: Diagnosis not present

## 2018-02-05 DIAGNOSIS — G8929 Other chronic pain: Secondary | ICD-10-CM | POA: Diagnosis not present

## 2018-02-05 DIAGNOSIS — M19012 Primary osteoarthritis, left shoulder: Secondary | ICD-10-CM | POA: Diagnosis not present

## 2018-02-05 DIAGNOSIS — H53002 Unspecified amblyopia, left eye: Secondary | ICD-10-CM | POA: Diagnosis not present

## 2018-02-05 DIAGNOSIS — E1140 Type 2 diabetes mellitus with diabetic neuropathy, unspecified: Secondary | ICD-10-CM | POA: Diagnosis not present

## 2018-02-05 DIAGNOSIS — M1711 Unilateral primary osteoarthritis, right knee: Secondary | ICD-10-CM | POA: Diagnosis not present

## 2018-02-05 DIAGNOSIS — H52203 Unspecified astigmatism, bilateral: Secondary | ICD-10-CM | POA: Diagnosis not present

## 2018-02-06 DIAGNOSIS — Z880 Allergy status to penicillin: Secondary | ICD-10-CM | POA: Diagnosis not present

## 2018-02-06 DIAGNOSIS — Z886 Allergy status to analgesic agent status: Secondary | ICD-10-CM | POA: Diagnosis not present

## 2018-02-06 DIAGNOSIS — Z885 Allergy status to narcotic agent status: Secondary | ICD-10-CM | POA: Diagnosis not present

## 2018-02-06 DIAGNOSIS — M25561 Pain in right knee: Secondary | ICD-10-CM | POA: Diagnosis not present

## 2018-02-06 DIAGNOSIS — Z882 Allergy status to sulfonamides status: Secondary | ICD-10-CM | POA: Diagnosis not present

## 2018-02-06 DIAGNOSIS — Z881 Allergy status to other antibiotic agents status: Secondary | ICD-10-CM | POA: Diagnosis not present

## 2018-02-06 DIAGNOSIS — Z91040 Latex allergy status: Secondary | ICD-10-CM | POA: Diagnosis not present

## 2018-02-06 DIAGNOSIS — M1711 Unilateral primary osteoarthritis, right knee: Secondary | ICD-10-CM | POA: Diagnosis not present

## 2018-02-06 DIAGNOSIS — Z888 Allergy status to other drugs, medicaments and biological substances status: Secondary | ICD-10-CM | POA: Diagnosis not present

## 2018-02-07 DIAGNOSIS — I10 Essential (primary) hypertension: Secondary | ICD-10-CM | POA: Diagnosis not present

## 2018-02-07 DIAGNOSIS — H5212 Myopia, left eye: Secondary | ICD-10-CM | POA: Diagnosis not present

## 2018-02-07 DIAGNOSIS — H2510 Age-related nuclear cataract, unspecified eye: Secondary | ICD-10-CM | POA: Diagnosis not present

## 2018-02-07 DIAGNOSIS — G8929 Other chronic pain: Secondary | ICD-10-CM | POA: Diagnosis not present

## 2018-02-07 DIAGNOSIS — J449 Chronic obstructive pulmonary disease, unspecified: Secondary | ICD-10-CM | POA: Diagnosis not present

## 2018-02-07 DIAGNOSIS — H52203 Unspecified astigmatism, bilateral: Secondary | ICD-10-CM | POA: Diagnosis not present

## 2018-02-07 DIAGNOSIS — M1711 Unilateral primary osteoarthritis, right knee: Secondary | ICD-10-CM | POA: Diagnosis not present

## 2018-02-07 DIAGNOSIS — Q762 Congenital spondylolisthesis: Secondary | ICD-10-CM | POA: Diagnosis not present

## 2018-02-07 DIAGNOSIS — H53002 Unspecified amblyopia, left eye: Secondary | ICD-10-CM | POA: Diagnosis not present

## 2018-02-07 DIAGNOSIS — M19012 Primary osteoarthritis, left shoulder: Secondary | ICD-10-CM | POA: Diagnosis not present

## 2018-02-07 DIAGNOSIS — E1140 Type 2 diabetes mellitus with diabetic neuropathy, unspecified: Secondary | ICD-10-CM | POA: Diagnosis not present

## 2018-02-12 DIAGNOSIS — M1711 Unilateral primary osteoarthritis, right knee: Secondary | ICD-10-CM | POA: Diagnosis not present

## 2018-02-12 DIAGNOSIS — E1140 Type 2 diabetes mellitus with diabetic neuropathy, unspecified: Secondary | ICD-10-CM | POA: Diagnosis not present

## 2018-02-12 DIAGNOSIS — G8929 Other chronic pain: Secondary | ICD-10-CM | POA: Diagnosis not present

## 2018-02-12 DIAGNOSIS — H2510 Age-related nuclear cataract, unspecified eye: Secondary | ICD-10-CM | POA: Diagnosis not present

## 2018-02-12 DIAGNOSIS — M19012 Primary osteoarthritis, left shoulder: Secondary | ICD-10-CM | POA: Diagnosis not present

## 2018-02-12 DIAGNOSIS — H53002 Unspecified amblyopia, left eye: Secondary | ICD-10-CM | POA: Diagnosis not present

## 2018-02-12 DIAGNOSIS — H52203 Unspecified astigmatism, bilateral: Secondary | ICD-10-CM | POA: Diagnosis not present

## 2018-02-12 DIAGNOSIS — I10 Essential (primary) hypertension: Secondary | ICD-10-CM | POA: Diagnosis not present

## 2018-02-12 DIAGNOSIS — Q762 Congenital spondylolisthesis: Secondary | ICD-10-CM | POA: Diagnosis not present

## 2018-02-12 DIAGNOSIS — H5212 Myopia, left eye: Secondary | ICD-10-CM | POA: Diagnosis not present

## 2018-02-12 DIAGNOSIS — J449 Chronic obstructive pulmonary disease, unspecified: Secondary | ICD-10-CM | POA: Diagnosis not present

## 2018-02-14 ENCOUNTER — Ambulatory Visit: Payer: Self-pay | Admitting: Family Medicine

## 2018-02-14 DIAGNOSIS — M545 Low back pain: Secondary | ICD-10-CM | POA: Diagnosis not present

## 2018-02-14 DIAGNOSIS — T148XXA Other injury of unspecified body region, initial encounter: Secondary | ICD-10-CM | POA: Diagnosis not present

## 2018-02-14 DIAGNOSIS — M25551 Pain in right hip: Secondary | ICD-10-CM | POA: Diagnosis not present

## 2018-02-15 DIAGNOSIS — Q762 Congenital spondylolisthesis: Secondary | ICD-10-CM | POA: Diagnosis not present

## 2018-02-15 DIAGNOSIS — H2510 Age-related nuclear cataract, unspecified eye: Secondary | ICD-10-CM | POA: Diagnosis not present

## 2018-02-15 DIAGNOSIS — M1711 Unilateral primary osteoarthritis, right knee: Secondary | ICD-10-CM | POA: Diagnosis not present

## 2018-02-15 DIAGNOSIS — I10 Essential (primary) hypertension: Secondary | ICD-10-CM | POA: Diagnosis not present

## 2018-02-15 DIAGNOSIS — H53002 Unspecified amblyopia, left eye: Secondary | ICD-10-CM | POA: Diagnosis not present

## 2018-02-15 DIAGNOSIS — E1140 Type 2 diabetes mellitus with diabetic neuropathy, unspecified: Secondary | ICD-10-CM | POA: Diagnosis not present

## 2018-02-15 DIAGNOSIS — J449 Chronic obstructive pulmonary disease, unspecified: Secondary | ICD-10-CM | POA: Diagnosis not present

## 2018-02-15 DIAGNOSIS — H5212 Myopia, left eye: Secondary | ICD-10-CM | POA: Diagnosis not present

## 2018-02-15 DIAGNOSIS — H52203 Unspecified astigmatism, bilateral: Secondary | ICD-10-CM | POA: Diagnosis not present

## 2018-02-15 DIAGNOSIS — G8929 Other chronic pain: Secondary | ICD-10-CM | POA: Diagnosis not present

## 2018-02-15 DIAGNOSIS — M19012 Primary osteoarthritis, left shoulder: Secondary | ICD-10-CM | POA: Diagnosis not present

## 2018-02-19 DIAGNOSIS — I10 Essential (primary) hypertension: Secondary | ICD-10-CM | POA: Diagnosis not present

## 2018-02-19 DIAGNOSIS — M19012 Primary osteoarthritis, left shoulder: Secondary | ICD-10-CM | POA: Diagnosis not present

## 2018-02-19 DIAGNOSIS — J449 Chronic obstructive pulmonary disease, unspecified: Secondary | ICD-10-CM | POA: Diagnosis not present

## 2018-02-19 DIAGNOSIS — H52203 Unspecified astigmatism, bilateral: Secondary | ICD-10-CM | POA: Diagnosis not present

## 2018-02-19 DIAGNOSIS — G8929 Other chronic pain: Secondary | ICD-10-CM | POA: Diagnosis not present

## 2018-02-19 DIAGNOSIS — M1711 Unilateral primary osteoarthritis, right knee: Secondary | ICD-10-CM | POA: Diagnosis not present

## 2018-02-19 DIAGNOSIS — H5212 Myopia, left eye: Secondary | ICD-10-CM | POA: Diagnosis not present

## 2018-02-19 DIAGNOSIS — Q762 Congenital spondylolisthesis: Secondary | ICD-10-CM | POA: Diagnosis not present

## 2018-02-19 DIAGNOSIS — H2510 Age-related nuclear cataract, unspecified eye: Secondary | ICD-10-CM | POA: Diagnosis not present

## 2018-02-19 DIAGNOSIS — H53002 Unspecified amblyopia, left eye: Secondary | ICD-10-CM | POA: Diagnosis not present

## 2018-02-19 DIAGNOSIS — E1140 Type 2 diabetes mellitus with diabetic neuropathy, unspecified: Secondary | ICD-10-CM | POA: Diagnosis not present

## 2018-02-20 DIAGNOSIS — M5136 Other intervertebral disc degeneration, lumbar region: Secondary | ICD-10-CM | POA: Diagnosis not present

## 2018-02-22 DIAGNOSIS — M1711 Unilateral primary osteoarthritis, right knee: Secondary | ICD-10-CM | POA: Diagnosis not present

## 2018-02-22 DIAGNOSIS — M19012 Primary osteoarthritis, left shoulder: Secondary | ICD-10-CM | POA: Diagnosis not present

## 2018-02-22 DIAGNOSIS — H5212 Myopia, left eye: Secondary | ICD-10-CM | POA: Diagnosis not present

## 2018-02-22 DIAGNOSIS — H53002 Unspecified amblyopia, left eye: Secondary | ICD-10-CM | POA: Diagnosis not present

## 2018-02-22 DIAGNOSIS — H52203 Unspecified astigmatism, bilateral: Secondary | ICD-10-CM | POA: Diagnosis not present

## 2018-02-22 DIAGNOSIS — I10 Essential (primary) hypertension: Secondary | ICD-10-CM | POA: Diagnosis not present

## 2018-02-22 DIAGNOSIS — J449 Chronic obstructive pulmonary disease, unspecified: Secondary | ICD-10-CM | POA: Diagnosis not present

## 2018-02-22 DIAGNOSIS — G8929 Other chronic pain: Secondary | ICD-10-CM | POA: Diagnosis not present

## 2018-02-22 DIAGNOSIS — Q762 Congenital spondylolisthesis: Secondary | ICD-10-CM | POA: Diagnosis not present

## 2018-02-22 DIAGNOSIS — H2510 Age-related nuclear cataract, unspecified eye: Secondary | ICD-10-CM | POA: Diagnosis not present

## 2018-02-22 DIAGNOSIS — E1140 Type 2 diabetes mellitus with diabetic neuropathy, unspecified: Secondary | ICD-10-CM | POA: Diagnosis not present

## 2018-02-27 DIAGNOSIS — R0683 Snoring: Secondary | ICD-10-CM | POA: Diagnosis not present

## 2018-02-27 DIAGNOSIS — I110 Hypertensive heart disease with heart failure: Secondary | ICD-10-CM | POA: Diagnosis not present

## 2018-02-27 DIAGNOSIS — G4733 Obstructive sleep apnea (adult) (pediatric): Secondary | ICD-10-CM | POA: Diagnosis not present

## 2018-02-27 DIAGNOSIS — R0902 Hypoxemia: Secondary | ICD-10-CM | POA: Diagnosis not present

## 2018-02-27 DIAGNOSIS — J449 Chronic obstructive pulmonary disease, unspecified: Secondary | ICD-10-CM | POA: Diagnosis not present

## 2018-02-27 DIAGNOSIS — R51 Headache: Secondary | ICD-10-CM | POA: Diagnosis not present

## 2018-02-27 DIAGNOSIS — I509 Heart failure, unspecified: Secondary | ICD-10-CM | POA: Diagnosis not present

## 2018-02-27 DIAGNOSIS — G478 Other sleep disorders: Secondary | ICD-10-CM | POA: Diagnosis not present

## 2018-02-27 DIAGNOSIS — R5383 Other fatigue: Secondary | ICD-10-CM | POA: Diagnosis not present

## 2018-03-05 DIAGNOSIS — E1140 Type 2 diabetes mellitus with diabetic neuropathy, unspecified: Secondary | ICD-10-CM | POA: Diagnosis not present

## 2018-03-05 DIAGNOSIS — M1711 Unilateral primary osteoarthritis, right knee: Secondary | ICD-10-CM | POA: Diagnosis not present

## 2018-03-05 DIAGNOSIS — Q762 Congenital spondylolisthesis: Secondary | ICD-10-CM | POA: Diagnosis not present

## 2018-03-05 DIAGNOSIS — G8929 Other chronic pain: Secondary | ICD-10-CM | POA: Diagnosis not present

## 2018-03-07 DIAGNOSIS — Z886 Allergy status to analgesic agent status: Secondary | ICD-10-CM | POA: Diagnosis not present

## 2018-03-07 DIAGNOSIS — Z882 Allergy status to sulfonamides status: Secondary | ICD-10-CM | POA: Diagnosis not present

## 2018-03-07 DIAGNOSIS — Z885 Allergy status to narcotic agent status: Secondary | ICD-10-CM | POA: Diagnosis not present

## 2018-03-07 DIAGNOSIS — Z880 Allergy status to penicillin: Secondary | ICD-10-CM | POA: Diagnosis not present

## 2018-03-07 DIAGNOSIS — M1711 Unilateral primary osteoarthritis, right knee: Secondary | ICD-10-CM | POA: Diagnosis not present

## 2018-03-07 DIAGNOSIS — Z888 Allergy status to other drugs, medicaments and biological substances status: Secondary | ICD-10-CM | POA: Diagnosis not present

## 2018-03-07 DIAGNOSIS — Z881 Allergy status to other antibiotic agents status: Secondary | ICD-10-CM | POA: Diagnosis not present

## 2018-03-12 DIAGNOSIS — I119 Hypertensive heart disease without heart failure: Secondary | ICD-10-CM | POA: Diagnosis not present

## 2018-03-12 DIAGNOSIS — G4733 Obstructive sleep apnea (adult) (pediatric): Secondary | ICD-10-CM | POA: Diagnosis not present

## 2018-03-12 DIAGNOSIS — I259 Chronic ischemic heart disease, unspecified: Secondary | ICD-10-CM | POA: Diagnosis not present

## 2018-03-12 DIAGNOSIS — Z87891 Personal history of nicotine dependence: Secondary | ICD-10-CM | POA: Diagnosis not present

## 2018-03-12 DIAGNOSIS — I272 Pulmonary hypertension, unspecified: Secondary | ICD-10-CM | POA: Diagnosis not present

## 2018-03-26 DIAGNOSIS — M1711 Unilateral primary osteoarthritis, right knee: Secondary | ICD-10-CM | POA: Diagnosis not present

## 2018-03-26 DIAGNOSIS — M5136 Other intervertebral disc degeneration, lumbar region: Secondary | ICD-10-CM | POA: Diagnosis not present

## 2018-03-30 DIAGNOSIS — M5136 Other intervertebral disc degeneration, lumbar region: Secondary | ICD-10-CM | POA: Diagnosis not present

## 2018-03-30 DIAGNOSIS — G8929 Other chronic pain: Secondary | ICD-10-CM | POA: Diagnosis not present

## 2018-03-30 DIAGNOSIS — M5441 Lumbago with sciatica, right side: Secondary | ICD-10-CM | POA: Diagnosis not present

## 2018-04-03 DIAGNOSIS — M5136 Other intervertebral disc degeneration, lumbar region: Secondary | ICD-10-CM | POA: Diagnosis not present

## 2018-04-03 DIAGNOSIS — M5441 Lumbago with sciatica, right side: Secondary | ICD-10-CM | POA: Diagnosis not present

## 2018-04-03 DIAGNOSIS — G8929 Other chronic pain: Secondary | ICD-10-CM | POA: Diagnosis not present

## 2018-04-04 DIAGNOSIS — M5136 Other intervertebral disc degeneration, lumbar region: Secondary | ICD-10-CM | POA: Diagnosis not present

## 2018-04-04 DIAGNOSIS — E1169 Type 2 diabetes mellitus with other specified complication: Secondary | ICD-10-CM | POA: Diagnosis not present

## 2018-04-04 DIAGNOSIS — E785 Hyperlipidemia, unspecified: Secondary | ICD-10-CM | POA: Diagnosis not present

## 2018-04-04 DIAGNOSIS — I10 Essential (primary) hypertension: Secondary | ICD-10-CM | POA: Diagnosis not present

## 2018-04-04 DIAGNOSIS — R1084 Generalized abdominal pain: Secondary | ICD-10-CM | POA: Diagnosis not present

## 2018-04-04 DIAGNOSIS — E118 Type 2 diabetes mellitus with unspecified complications: Secondary | ICD-10-CM | POA: Diagnosis not present

## 2018-04-04 DIAGNOSIS — M7062 Trochanteric bursitis, left hip: Secondary | ICD-10-CM | POA: Diagnosis not present

## 2018-04-05 DIAGNOSIS — R1084 Generalized abdominal pain: Secondary | ICD-10-CM | POA: Diagnosis not present

## 2018-04-09 DIAGNOSIS — G8929 Other chronic pain: Secondary | ICD-10-CM | POA: Diagnosis not present

## 2018-04-09 DIAGNOSIS — M5136 Other intervertebral disc degeneration, lumbar region: Secondary | ICD-10-CM | POA: Diagnosis not present

## 2018-04-09 DIAGNOSIS — M5441 Lumbago with sciatica, right side: Secondary | ICD-10-CM | POA: Diagnosis not present

## 2018-04-11 DIAGNOSIS — M5116 Intervertebral disc disorders with radiculopathy, lumbar region: Secondary | ICD-10-CM | POA: Diagnosis not present

## 2018-05-07 DIAGNOSIS — Z1231 Encounter for screening mammogram for malignant neoplasm of breast: Secondary | ICD-10-CM | POA: Diagnosis not present

## 2018-05-08 DIAGNOSIS — E118 Type 2 diabetes mellitus with unspecified complications: Secondary | ICD-10-CM | POA: Diagnosis not present

## 2018-05-08 DIAGNOSIS — Z720 Tobacco use: Secondary | ICD-10-CM | POA: Diagnosis not present

## 2018-05-16 DIAGNOSIS — G43019 Migraine without aura, intractable, without status migrainosus: Secondary | ICD-10-CM | POA: Diagnosis not present

## 2018-05-21 DIAGNOSIS — K219 Gastro-esophageal reflux disease without esophagitis: Secondary | ICD-10-CM | POA: Diagnosis not present

## 2018-05-21 DIAGNOSIS — E118 Type 2 diabetes mellitus with unspecified complications: Secondary | ICD-10-CM | POA: Diagnosis not present

## 2018-05-21 DIAGNOSIS — G894 Chronic pain syndrome: Secondary | ICD-10-CM | POA: Diagnosis not present

## 2018-05-29 DIAGNOSIS — G43019 Migraine without aura, intractable, without status migrainosus: Secondary | ICD-10-CM | POA: Diagnosis not present

## 2018-05-29 DIAGNOSIS — Z720 Tobacco use: Secondary | ICD-10-CM | POA: Diagnosis not present

## 2018-05-29 DIAGNOSIS — E118 Type 2 diabetes mellitus with unspecified complications: Secondary | ICD-10-CM | POA: Diagnosis not present

## 2018-05-31 DIAGNOSIS — M4726 Other spondylosis with radiculopathy, lumbar region: Secondary | ICD-10-CM | POA: Diagnosis not present

## 2018-05-31 DIAGNOSIS — M4316 Spondylolisthesis, lumbar region: Secondary | ICD-10-CM | POA: Diagnosis not present

## 2018-05-31 DIAGNOSIS — R269 Unspecified abnormalities of gait and mobility: Secondary | ICD-10-CM | POA: Diagnosis not present

## 2018-05-31 DIAGNOSIS — M25551 Pain in right hip: Secondary | ICD-10-CM | POA: Diagnosis not present

## 2018-05-31 DIAGNOSIS — M5136 Other intervertebral disc degeneration, lumbar region: Secondary | ICD-10-CM | POA: Diagnosis not present

## 2018-06-08 DIAGNOSIS — M5416 Radiculopathy, lumbar region: Secondary | ICD-10-CM | POA: Diagnosis not present

## 2018-06-12 DIAGNOSIS — E118 Type 2 diabetes mellitus with unspecified complications: Secondary | ICD-10-CM | POA: Diagnosis not present

## 2018-06-13 DIAGNOSIS — G4733 Obstructive sleep apnea (adult) (pediatric): Secondary | ICD-10-CM | POA: Diagnosis not present

## 2018-06-19 DIAGNOSIS — E038 Other specified hypothyroidism: Secondary | ICD-10-CM | POA: Diagnosis not present

## 2018-06-19 DIAGNOSIS — R946 Abnormal results of thyroid function studies: Secondary | ICD-10-CM | POA: Diagnosis not present

## 2018-06-26 DIAGNOSIS — E119 Type 2 diabetes mellitus without complications: Secondary | ICD-10-CM | POA: Diagnosis not present

## 2018-07-06 DIAGNOSIS — Z9049 Acquired absence of other specified parts of digestive tract: Secondary | ICD-10-CM | POA: Diagnosis not present

## 2018-07-06 DIAGNOSIS — I998 Other disorder of circulatory system: Secondary | ICD-10-CM | POA: Diagnosis not present

## 2018-07-06 DIAGNOSIS — M4316 Spondylolisthesis, lumbar region: Secondary | ICD-10-CM | POA: Diagnosis not present

## 2018-07-06 DIAGNOSIS — M47817 Spondylosis without myelopathy or radiculopathy, lumbosacral region: Secondary | ICD-10-CM | POA: Diagnosis not present

## 2018-07-06 DIAGNOSIS — G8929 Other chronic pain: Secondary | ICD-10-CM | POA: Diagnosis not present

## 2018-07-06 DIAGNOSIS — M461 Sacroiliitis, not elsewhere classified: Secondary | ICD-10-CM | POA: Diagnosis not present

## 2018-07-06 DIAGNOSIS — M5136 Other intervertebral disc degeneration, lumbar region: Secondary | ICD-10-CM | POA: Diagnosis not present

## 2018-07-06 DIAGNOSIS — M47814 Spondylosis without myelopathy or radiculopathy, thoracic region: Secondary | ICD-10-CM | POA: Diagnosis not present

## 2018-07-06 DIAGNOSIS — M4317 Spondylolisthesis, lumbosacral region: Secondary | ICD-10-CM | POA: Diagnosis not present

## 2018-07-06 DIAGNOSIS — M4726 Other spondylosis with radiculopathy, lumbar region: Secondary | ICD-10-CM | POA: Diagnosis not present

## 2018-07-06 DIAGNOSIS — M545 Low back pain: Secondary | ICD-10-CM | POA: Diagnosis not present

## 2018-07-06 DIAGNOSIS — M5134 Other intervertebral disc degeneration, thoracic region: Secondary | ICD-10-CM | POA: Diagnosis not present

## 2018-07-06 DIAGNOSIS — Z955 Presence of coronary angioplasty implant and graft: Secondary | ICD-10-CM | POA: Diagnosis not present

## 2018-07-06 DIAGNOSIS — M4186 Other forms of scoliosis, lumbar region: Secondary | ICD-10-CM | POA: Diagnosis not present

## 2018-07-06 DIAGNOSIS — M546 Pain in thoracic spine: Secondary | ICD-10-CM | POA: Diagnosis not present

## 2018-07-10 DIAGNOSIS — E119 Type 2 diabetes mellitus without complications: Secondary | ICD-10-CM | POA: Diagnosis not present

## 2018-07-10 DIAGNOSIS — Z720 Tobacco use: Secondary | ICD-10-CM | POA: Diagnosis not present

## 2018-07-13 DIAGNOSIS — R739 Hyperglycemia, unspecified: Secondary | ICD-10-CM | POA: Diagnosis not present

## 2018-07-26 DIAGNOSIS — H52203 Unspecified astigmatism, bilateral: Secondary | ICD-10-CM | POA: Diagnosis not present

## 2018-07-26 DIAGNOSIS — H2513 Age-related nuclear cataract, bilateral: Secondary | ICD-10-CM | POA: Diagnosis not present

## 2018-07-26 DIAGNOSIS — H524 Presbyopia: Secondary | ICD-10-CM | POA: Diagnosis not present

## 2018-07-26 DIAGNOSIS — E119 Type 2 diabetes mellitus without complications: Secondary | ICD-10-CM | POA: Diagnosis not present

## 2018-07-26 DIAGNOSIS — H5213 Myopia, bilateral: Secondary | ICD-10-CM | POA: Diagnosis not present

## 2018-08-01 DIAGNOSIS — E119 Type 2 diabetes mellitus without complications: Secondary | ICD-10-CM | POA: Diagnosis not present

## 2018-08-01 DIAGNOSIS — K219 Gastro-esophageal reflux disease without esophagitis: Secondary | ICD-10-CM | POA: Diagnosis not present

## 2018-08-01 DIAGNOSIS — E1169 Type 2 diabetes mellitus with other specified complication: Secondary | ICD-10-CM | POA: Diagnosis not present

## 2018-08-03 DIAGNOSIS — E785 Hyperlipidemia, unspecified: Secondary | ICD-10-CM | POA: Diagnosis not present

## 2018-08-03 DIAGNOSIS — H02831 Dermatochalasis of right upper eyelid: Secondary | ICD-10-CM | POA: Diagnosis not present

## 2018-08-03 DIAGNOSIS — G4733 Obstructive sleep apnea (adult) (pediatric): Secondary | ICD-10-CM | POA: Diagnosis not present

## 2018-08-03 DIAGNOSIS — D649 Anemia, unspecified: Secondary | ICD-10-CM | POA: Diagnosis not present

## 2018-08-03 DIAGNOSIS — M1990 Unspecified osteoarthritis, unspecified site: Secondary | ICD-10-CM | POA: Diagnosis not present

## 2018-08-03 DIAGNOSIS — J309 Allergic rhinitis, unspecified: Secondary | ICD-10-CM | POA: Diagnosis not present

## 2018-08-03 DIAGNOSIS — I2510 Atherosclerotic heart disease of native coronary artery without angina pectoris: Secondary | ICD-10-CM | POA: Diagnosis not present

## 2018-08-03 DIAGNOSIS — M47816 Spondylosis without myelopathy or radiculopathy, lumbar region: Secondary | ICD-10-CM | POA: Diagnosis not present

## 2018-08-03 DIAGNOSIS — M533 Sacrococcygeal disorders, not elsewhere classified: Secondary | ICD-10-CM | POA: Diagnosis not present

## 2018-08-03 DIAGNOSIS — J984 Other disorders of lung: Secondary | ICD-10-CM | POA: Diagnosis not present

## 2018-08-03 DIAGNOSIS — G894 Chronic pain syndrome: Secondary | ICD-10-CM | POA: Diagnosis not present

## 2018-08-03 DIAGNOSIS — R55 Syncope and collapse: Secondary | ICD-10-CM | POA: Diagnosis not present

## 2018-08-03 DIAGNOSIS — K219 Gastro-esophageal reflux disease without esophagitis: Secondary | ICD-10-CM | POA: Diagnosis not present

## 2018-08-03 DIAGNOSIS — G43909 Migraine, unspecified, not intractable, without status migrainosus: Secondary | ICD-10-CM | POA: Diagnosis not present

## 2018-08-03 DIAGNOSIS — J449 Chronic obstructive pulmonary disease, unspecified: Secondary | ICD-10-CM | POA: Diagnosis not present

## 2018-08-03 DIAGNOSIS — Z79899 Other long term (current) drug therapy: Secondary | ICD-10-CM | POA: Diagnosis not present

## 2018-08-03 DIAGNOSIS — M7918 Myalgia, other site: Secondary | ICD-10-CM | POA: Diagnosis not present

## 2018-08-03 DIAGNOSIS — E1142 Type 2 diabetes mellitus with diabetic polyneuropathy: Secondary | ICD-10-CM | POA: Diagnosis not present

## 2018-08-03 DIAGNOSIS — M5136 Other intervertebral disc degeneration, lumbar region: Secondary | ICD-10-CM | POA: Diagnosis not present

## 2018-08-03 DIAGNOSIS — I252 Old myocardial infarction: Secondary | ICD-10-CM | POA: Diagnosis not present

## 2018-08-03 DIAGNOSIS — M461 Sacroiliitis, not elsewhere classified: Secondary | ICD-10-CM | POA: Diagnosis not present

## 2018-08-03 DIAGNOSIS — K589 Irritable bowel syndrome without diarrhea: Secondary | ICD-10-CM | POA: Diagnosis not present

## 2018-08-03 DIAGNOSIS — H02834 Dermatochalasis of left upper eyelid: Secondary | ICD-10-CM | POA: Diagnosis not present

## 2018-08-06 DIAGNOSIS — G4733 Obstructive sleep apnea (adult) (pediatric): Secondary | ICD-10-CM | POA: Diagnosis not present

## 2018-08-06 DIAGNOSIS — E119 Type 2 diabetes mellitus without complications: Secondary | ICD-10-CM | POA: Diagnosis not present

## 2018-08-06 DIAGNOSIS — I252 Old myocardial infarction: Secondary | ICD-10-CM | POA: Diagnosis not present

## 2018-08-06 DIAGNOSIS — I2510 Atherosclerotic heart disease of native coronary artery without angina pectoris: Secondary | ICD-10-CM | POA: Diagnosis not present

## 2018-08-06 DIAGNOSIS — I10 Essential (primary) hypertension: Secondary | ICD-10-CM | POA: Diagnosis not present

## 2018-08-29 DIAGNOSIS — M533 Sacrococcygeal disorders, not elsewhere classified: Secondary | ICD-10-CM | POA: Diagnosis not present

## 2018-08-29 DIAGNOSIS — Z79899 Other long term (current) drug therapy: Secondary | ICD-10-CM | POA: Diagnosis not present

## 2018-09-06 DIAGNOSIS — W19XXXA Unspecified fall, initial encounter: Secondary | ICD-10-CM | POA: Diagnosis not present

## 2018-09-06 DIAGNOSIS — G4733 Obstructive sleep apnea (adult) (pediatric): Secondary | ICD-10-CM | POA: Diagnosis not present

## 2018-09-06 DIAGNOSIS — Z9889 Other specified postprocedural states: Secondary | ICD-10-CM | POA: Diagnosis not present

## 2018-09-06 DIAGNOSIS — M170 Bilateral primary osteoarthritis of knee: Secondary | ICD-10-CM | POA: Diagnosis not present

## 2018-09-06 DIAGNOSIS — E0842 Diabetes mellitus due to underlying condition with diabetic polyneuropathy: Secondary | ICD-10-CM | POA: Diagnosis not present

## 2018-09-13 DIAGNOSIS — E119 Type 2 diabetes mellitus without complications: Secondary | ICD-10-CM | POA: Diagnosis not present

## 2018-09-13 DIAGNOSIS — G4733 Obstructive sleep apnea (adult) (pediatric): Secondary | ICD-10-CM | POA: Diagnosis not present

## 2018-09-13 DIAGNOSIS — K219 Gastro-esophageal reflux disease without esophagitis: Secondary | ICD-10-CM | POA: Diagnosis not present

## 2018-09-13 DIAGNOSIS — R911 Solitary pulmonary nodule: Secondary | ICD-10-CM | POA: Diagnosis not present

## 2018-09-13 DIAGNOSIS — E1169 Type 2 diabetes mellitus with other specified complication: Secondary | ICD-10-CM | POA: Diagnosis not present

## 2018-09-13 DIAGNOSIS — I10 Essential (primary) hypertension: Secondary | ICD-10-CM | POA: Diagnosis not present

## 2018-09-17 DIAGNOSIS — M5136 Other intervertebral disc degeneration, lumbar region: Secondary | ICD-10-CM | POA: Diagnosis not present

## 2018-09-17 DIAGNOSIS — M7918 Myalgia, other site: Secondary | ICD-10-CM | POA: Diagnosis not present

## 2018-09-17 DIAGNOSIS — M1288 Other specific arthropathies, not elsewhere classified, other specified site: Secondary | ICD-10-CM | POA: Diagnosis not present

## 2018-09-17 DIAGNOSIS — M533 Sacrococcygeal disorders, not elsewhere classified: Secondary | ICD-10-CM | POA: Diagnosis not present

## 2018-09-17 DIAGNOSIS — M47816 Spondylosis without myelopathy or radiculopathy, lumbar region: Secondary | ICD-10-CM | POA: Diagnosis not present

## 2018-09-24 DIAGNOSIS — J069 Acute upper respiratory infection, unspecified: Secondary | ICD-10-CM | POA: Diagnosis not present

## 2018-09-24 DIAGNOSIS — R946 Abnormal results of thyroid function studies: Secondary | ICD-10-CM | POA: Diagnosis not present

## 2018-09-27 DIAGNOSIS — J4520 Mild intermittent asthma, uncomplicated: Secondary | ICD-10-CM | POA: Diagnosis not present

## 2018-09-27 DIAGNOSIS — J441 Chronic obstructive pulmonary disease with (acute) exacerbation: Secondary | ICD-10-CM | POA: Diagnosis not present

## 2018-09-27 DIAGNOSIS — J42 Unspecified chronic bronchitis: Secondary | ICD-10-CM | POA: Diagnosis not present

## 2018-10-02 DIAGNOSIS — M8588 Other specified disorders of bone density and structure, other site: Secondary | ICD-10-CM | POA: Diagnosis not present

## 2018-10-02 DIAGNOSIS — M899 Disorder of bone, unspecified: Secondary | ICD-10-CM | POA: Diagnosis not present

## 2018-10-07 DIAGNOSIS — W19XXXA Unspecified fall, initial encounter: Secondary | ICD-10-CM | POA: Diagnosis not present

## 2018-10-07 DIAGNOSIS — G4733 Obstructive sleep apnea (adult) (pediatric): Secondary | ICD-10-CM | POA: Diagnosis not present

## 2018-10-10 DIAGNOSIS — M7918 Myalgia, other site: Secondary | ICD-10-CM | POA: Diagnosis not present

## 2018-10-10 DIAGNOSIS — M545 Low back pain: Secondary | ICD-10-CM | POA: Diagnosis not present

## 2018-10-10 DIAGNOSIS — R262 Difficulty in walking, not elsewhere classified: Secondary | ICD-10-CM | POA: Diagnosis not present

## 2018-10-10 DIAGNOSIS — M533 Sacrococcygeal disorders, not elsewhere classified: Secondary | ICD-10-CM | POA: Diagnosis not present

## 2018-10-24 DIAGNOSIS — K219 Gastro-esophageal reflux disease without esophagitis: Secondary | ICD-10-CM | POA: Diagnosis not present

## 2018-10-24 DIAGNOSIS — G4733 Obstructive sleep apnea (adult) (pediatric): Secondary | ICD-10-CM | POA: Diagnosis not present

## 2018-10-24 DIAGNOSIS — I10 Essential (primary) hypertension: Secondary | ICD-10-CM | POA: Diagnosis not present

## 2018-10-24 DIAGNOSIS — E118 Type 2 diabetes mellitus with unspecified complications: Secondary | ICD-10-CM | POA: Diagnosis not present

## 2018-11-06 DIAGNOSIS — Z9989 Dependence on other enabling machines and devices: Secondary | ICD-10-CM | POA: Diagnosis not present

## 2018-11-06 DIAGNOSIS — G4733 Obstructive sleep apnea (adult) (pediatric): Secondary | ICD-10-CM | POA: Diagnosis not present

## 2018-11-07 DIAGNOSIS — W19XXXA Unspecified fall, initial encounter: Secondary | ICD-10-CM | POA: Diagnosis not present

## 2018-11-07 DIAGNOSIS — G4733 Obstructive sleep apnea (adult) (pediatric): Secondary | ICD-10-CM | POA: Diagnosis not present

## 2018-11-13 DIAGNOSIS — J42 Unspecified chronic bronchitis: Secondary | ICD-10-CM | POA: Diagnosis not present

## 2018-11-13 DIAGNOSIS — E1169 Type 2 diabetes mellitus with other specified complication: Secondary | ICD-10-CM | POA: Diagnosis not present

## 2018-11-13 DIAGNOSIS — K219 Gastro-esophageal reflux disease without esophagitis: Secondary | ICD-10-CM | POA: Diagnosis not present

## 2018-11-19 DIAGNOSIS — Z9889 Other specified postprocedural states: Secondary | ICD-10-CM | POA: Diagnosis not present

## 2018-11-19 DIAGNOSIS — M170 Bilateral primary osteoarthritis of knee: Secondary | ICD-10-CM | POA: Diagnosis not present

## 2018-11-19 DIAGNOSIS — W19XXXA Unspecified fall, initial encounter: Secondary | ICD-10-CM | POA: Diagnosis not present

## 2018-11-19 DIAGNOSIS — G4733 Obstructive sleep apnea (adult) (pediatric): Secondary | ICD-10-CM | POA: Diagnosis not present

## 2018-11-19 DIAGNOSIS — E0842 Diabetes mellitus due to underlying condition with diabetic polyneuropathy: Secondary | ICD-10-CM | POA: Diagnosis not present

## 2018-11-28 DIAGNOSIS — M47816 Spondylosis without myelopathy or radiculopathy, lumbar region: Secondary | ICD-10-CM | POA: Diagnosis not present

## 2018-11-28 DIAGNOSIS — R739 Hyperglycemia, unspecified: Secondary | ICD-10-CM | POA: Diagnosis not present

## 2018-12-05 DIAGNOSIS — Z5320 Procedure and treatment not carried out because of patient's decision for unspecified reasons: Secondary | ICD-10-CM | POA: Diagnosis not present

## 2018-12-05 DIAGNOSIS — R0602 Shortness of breath: Secondary | ICD-10-CM | POA: Diagnosis not present

## 2018-12-05 DIAGNOSIS — R0981 Nasal congestion: Secondary | ICD-10-CM | POA: Diagnosis not present

## 2018-12-05 DIAGNOSIS — R000 Tachycardia, unspecified: Secondary | ICD-10-CM | POA: Diagnosis not present

## 2018-12-05 DIAGNOSIS — R05 Cough: Secondary | ICD-10-CM | POA: Diagnosis not present

## 2018-12-06 DIAGNOSIS — M170 Bilateral primary osteoarthritis of knee: Secondary | ICD-10-CM | POA: Diagnosis not present

## 2018-12-06 DIAGNOSIS — E0842 Diabetes mellitus due to underlying condition with diabetic polyneuropathy: Secondary | ICD-10-CM | POA: Diagnosis not present

## 2018-12-06 DIAGNOSIS — G4733 Obstructive sleep apnea (adult) (pediatric): Secondary | ICD-10-CM | POA: Diagnosis not present

## 2018-12-07 ENCOUNTER — Other Ambulatory Visit: Payer: Self-pay

## 2018-12-07 DIAGNOSIS — D72819 Decreased white blood cell count, unspecified: Secondary | ICD-10-CM | POA: Diagnosis not present

## 2018-12-07 DIAGNOSIS — J441 Chronic obstructive pulmonary disease with (acute) exacerbation: Secondary | ICD-10-CM | POA: Diagnosis not present

## 2018-12-07 NOTE — Patient Outreach (Signed)
[facility]) Care Management  12/07/2018  Philamena Kramar [birth year] [id number]   Telephone Screen  Referral Date: 12/07/2018 Referral Source: [facility]. Referral Reason: " needs to lower blood sugar, back and bilateral knee pain-difficulty walking-uses cane, has had left knee surgery, needs right one, PT co pays cost too much" Insurance: [facility] Medicare   Outreach attempt # 1 to patient. No answer. RN CM left HIPAA compliant voicemail message along with contact info.      Plan: RN CM will make outreach attempt to patient within 3-4 business days. RN CM will send unsuccessful outreach letter to patient.   Enzo Montgomery, RN,BSN,CCM [facility] Telephonic Care Management Coordinator Direct Phone: [phone] Toll Free: [phone] Fax: [phone]

## 2018-12-11 ENCOUNTER — Other Ambulatory Visit: Payer: Self-pay

## 2018-12-11 NOTE — Patient Outreach (Signed)
[facility]) Care Management  12/11/2018  Cathline Dowen Jul 25, 1956 [id number]   Telephone Screen  Referral Date: 12/07/2018 Referral Source: [facility]. Referral Reason: " needs to lower blood sugar, back and bilateral knee pain-difficulty walking-uses cane, has had left knee surgery, needs right one, PT co pays cost too much" Insurance: [facility] Medicare   Outreach attempt #2 to patient. No answer at present.     Plan: RN CM will make outreach attempt to patient within 3-4 business days.   Enzo Montgomery, RN,BSN,CCM [place] [facility] Telephonic Care Management Coordinator Direct Phone: [phone] Toll Free: [phone] Fax: [phone]

## 2018-12-13 ENCOUNTER — Other Ambulatory Visit: Payer: Self-pay

## 2018-12-13 NOTE — Patient Outreach (Signed)
[place] [facility]) Care Management  12/13/2018  Christine Hebert 02-14-1956 [id number]   Telephone Screen  Referral Date:12/07/2018 Referral Source:[facility] Dept. Referral Reason:" needs to lower blood sugar, back and bilateral knee pain-difficulty walking-uses cane, has had left knee surgery, needs right one, PT co pays cost too much" Insurance:[employer] Medicare   Outreach attempt #3 to patient. Spoke with patient and screening completed. Patient resides in her home along with her spouse. She voices that she is fairly independent with ADLs/IADLs. She denies any recent falls this year. She reports that she has cane and walker in the home to use as needed. Patient relies on spouse to take her to appts.   Conditions: Per patient report, she has PMH of DM,HTN, HLD, OA and MI. She reports that she knows she needs to do better in managing and caring for her health. She states that she lacks and needs motivation and has issues with procrastination. Patient reports that she "craves carbs" and that is her weakness. She voices that she needs to do better with making healthier meal choices in regards to sugar and salt intake. She has cbg meter in the home. She admits that she had not been consistently checking and managing Diabetes in the home until recently. She has not checked cbg this morning but voices cbg yesterday was 154. She reports taking cbgs once a day. Patient requesting information and education on food choices and better eating habits. She reports that she wants to start exercising but is not ableto due to OA pain.   Medications: Patient reports taking about 10 meds. She denies any issues affording and/or managing meds at this time. Patient states that she fills med planner weekly for herself.  Appointments; Patient followed by PCP. She reports she has not seen PCP this year yet and needs to make an appt. She voices that she is also followed by Dr. Melissa Montane. doesn't know what kind of  doctor MD is).  Consent: [facility] services reviewed and discussed with patient. Verbal consent for services given.     Plan: RN CM will send [place] referral for further disease mgmt education and support.   Enzo Montgomery, RN,BSN,CCM [place] Management Telephonic Care Management Coordinator Direct Phone: [phone] Toll Free: [phone] Fax: [phone]

## 2018-12-17 ENCOUNTER — Other Ambulatory Visit: Payer: Self-pay | Admitting: *Deleted

## 2019-01-06 DIAGNOSIS — M170 Bilateral primary osteoarthritis of knee: Secondary | ICD-10-CM | POA: Diagnosis not present

## 2019-01-06 DIAGNOSIS — E0842 Diabetes mellitus due to underlying condition with diabetic polyneuropathy: Secondary | ICD-10-CM | POA: Diagnosis not present

## 2019-01-06 DIAGNOSIS — G4733 Obstructive sleep apnea (adult) (pediatric): Secondary | ICD-10-CM | POA: Diagnosis not present

## 2019-01-07 ENCOUNTER — Other Ambulatory Visit: Payer: Self-pay | Admitting: *Deleted

## 2019-01-07 DIAGNOSIS — R2681 Unsteadiness on feet: Secondary | ICD-10-CM | POA: Diagnosis not present

## 2019-01-07 DIAGNOSIS — Z7982 Long term (current) use of aspirin: Secondary | ICD-10-CM | POA: Diagnosis not present

## 2019-01-07 DIAGNOSIS — J449 Chronic obstructive pulmonary disease, unspecified: Secondary | ICD-10-CM | POA: Diagnosis not present

## 2019-01-07 DIAGNOSIS — I10 Essential (primary) hypertension: Secondary | ICD-10-CM | POA: Diagnosis not present

## 2019-01-07 DIAGNOSIS — Z7984 Long term (current) use of oral hypoglycemic drugs: Secondary | ICD-10-CM | POA: Diagnosis not present

## 2019-01-07 DIAGNOSIS — G459 Transient cerebral ischemic attack, unspecified: Secondary | ICD-10-CM | POA: Diagnosis not present

## 2019-01-07 DIAGNOSIS — K219 Gastro-esophageal reflux disease without esophagitis: Secondary | ICD-10-CM | POA: Diagnosis not present

## 2019-01-07 DIAGNOSIS — H532 Diplopia: Secondary | ICD-10-CM | POA: Diagnosis not present

## 2019-01-07 DIAGNOSIS — Z955 Presence of coronary angioplasty implant and graft: Secondary | ICD-10-CM | POA: Diagnosis not present

## 2019-01-07 DIAGNOSIS — R51 Headache: Secondary | ICD-10-CM | POA: Diagnosis not present

## 2019-01-07 DIAGNOSIS — R633 Feeding difficulties: Secondary | ICD-10-CM | POA: Diagnosis not present

## 2019-01-07 DIAGNOSIS — Z803 Family history of malignant neoplasm of breast: Secondary | ICD-10-CM | POA: Diagnosis not present

## 2019-01-07 DIAGNOSIS — R42 Dizziness and giddiness: Secondary | ICD-10-CM | POA: Diagnosis not present

## 2019-01-07 DIAGNOSIS — Z833 Family history of diabetes mellitus: Secondary | ICD-10-CM | POA: Diagnosis not present

## 2019-01-07 DIAGNOSIS — I252 Old myocardial infarction: Secondary | ICD-10-CM | POA: Diagnosis not present

## 2019-01-07 DIAGNOSIS — R042 Hemoptysis: Secondary | ICD-10-CM | POA: Diagnosis not present

## 2019-01-07 DIAGNOSIS — Z87891 Personal history of nicotine dependence: Secondary | ICD-10-CM | POA: Diagnosis not present

## 2019-01-07 DIAGNOSIS — I2510 Atherosclerotic heart disease of native coronary artery without angina pectoris: Secondary | ICD-10-CM | POA: Diagnosis not present

## 2019-01-07 DIAGNOSIS — E785 Hyperlipidemia, unspecified: Secondary | ICD-10-CM | POA: Diagnosis not present

## 2019-01-07 DIAGNOSIS — E1140 Type 2 diabetes mellitus with diabetic neuropathy, unspecified: Secondary | ICD-10-CM | POA: Diagnosis not present

## 2019-01-07 DIAGNOSIS — R1310 Dysphagia, unspecified: Secondary | ICD-10-CM | POA: Diagnosis not present

## 2019-01-07 DIAGNOSIS — Z8249 Family history of ischemic heart disease and other diseases of the circulatory system: Secondary | ICD-10-CM | POA: Diagnosis not present

## 2019-01-07 DIAGNOSIS — I6389 Other cerebral infarction: Secondary | ICD-10-CM | POA: Diagnosis not present

## 2019-01-07 DIAGNOSIS — Z7951 Long term (current) use of inhaled steroids: Secondary | ICD-10-CM | POA: Diagnosis not present

## 2019-01-07 DIAGNOSIS — R05 Cough: Secondary | ICD-10-CM | POA: Diagnosis not present

## 2019-01-07 DIAGNOSIS — H538 Other visual disturbances: Secondary | ICD-10-CM | POA: Diagnosis not present

## 2019-01-07 DIAGNOSIS — G4733 Obstructive sleep apnea (adult) (pediatric): Secondary | ICD-10-CM | POA: Diagnosis not present

## 2019-01-07 DIAGNOSIS — I498 Other specified cardiac arrhythmias: Secondary | ICD-10-CM | POA: Diagnosis not present

## 2019-01-07 DIAGNOSIS — E119 Type 2 diabetes mellitus without complications: Secondary | ICD-10-CM | POA: Diagnosis not present

## 2019-01-07 DIAGNOSIS — I493 Ventricular premature depolarization: Secondary | ICD-10-CM | POA: Diagnosis not present

## 2019-01-07 DIAGNOSIS — R9431 Abnormal electrocardiogram [ECG] [EKG]: Secondary | ICD-10-CM | POA: Diagnosis not present

## 2019-01-07 DIAGNOSIS — I639 Cerebral infarction, unspecified: Secondary | ICD-10-CM | POA: Diagnosis not present

## 2019-01-07 NOTE — Patient Outreach (Signed)
[place] [facility]) Care Management  01/07/2019  Christine Hebert May 23, 1956 [id number]   RN Health Coach Initial Assessment  Referral Date:  12/13/2018 Referral Source: [facility] Reason for Referral:  "Needs to lower her blood sugar" Insurance:  [employer] Attempt:   Outreach attempt #1 to patient for introduction and initial telephone assessment.  Patient answered and verified HIPAA.  RN Health Coach introduced self and role.  Patient verbally agrees to Disease Management outreaches.  Patient reports not feeling well since Emergency Room Visit in March.  States she did complete the antibiotics and prednisone taper ordered in March and started to feel some better after; but now her cough has worsened and sputum is harder to get up.  Also reporting vision trouble.  States she has not checked her temperature, but has been having chills and sweats.  Patient also stating she has been having tremors in her extremities and when she spoke with the pharmacy today, they told her it may be an interaction with her medications (Zoloft and Buspar); requested she contact her psychiatrist.  Due to patient's symptoms, RN Health Coach instructed and encouraged patient to contact her primary care provider (Dr. Sue Lush or Dr. Neal--patient stating she sees both as primary care) for guidance of her symptoms and whether or not she needs to be tested for COVID-19.  Also instructed and encouraged patient to contact her psychiatrist per pharmacy request.  Requested patient contact providers today for guidance concerning her symptoms and initial assessment will be completed at another time.  Patient stated her understanding and that she would contact her physician.  Plan:  RN Health Coach will make another telephone outreach to patient within the month of April.   [place] [phone] [email or web address]

## 2019-01-10 ENCOUNTER — Other Ambulatory Visit: Payer: Self-pay | Admitting: *Deleted

## 2019-01-10 DIAGNOSIS — D649 Anemia, unspecified: Secondary | ICD-10-CM | POA: Diagnosis not present

## 2019-01-10 DIAGNOSIS — K219 Gastro-esophageal reflux disease without esophagitis: Secondary | ICD-10-CM | POA: Diagnosis not present

## 2019-01-10 DIAGNOSIS — I2510 Atherosclerotic heart disease of native coronary artery without angina pectoris: Secondary | ICD-10-CM | POA: Diagnosis not present

## 2019-01-10 DIAGNOSIS — I69398 Other sequelae of cerebral infarction: Secondary | ICD-10-CM | POA: Diagnosis not present

## 2019-01-10 DIAGNOSIS — J449 Chronic obstructive pulmonary disease, unspecified: Secondary | ICD-10-CM | POA: Diagnosis not present

## 2019-01-10 DIAGNOSIS — I110 Hypertensive heart disease with heart failure: Secondary | ICD-10-CM | POA: Diagnosis not present

## 2019-01-10 DIAGNOSIS — I252 Old myocardial infarction: Secondary | ICD-10-CM | POA: Diagnosis not present

## 2019-01-10 DIAGNOSIS — R51 Headache: Secondary | ICD-10-CM | POA: Diagnosis not present

## 2019-01-10 DIAGNOSIS — E785 Hyperlipidemia, unspecified: Secondary | ICD-10-CM | POA: Diagnosis not present

## 2019-01-10 DIAGNOSIS — R42 Dizziness and giddiness: Secondary | ICD-10-CM | POA: Diagnosis not present

## 2019-01-10 DIAGNOSIS — E1140 Type 2 diabetes mellitus with diabetic neuropathy, unspecified: Secondary | ICD-10-CM | POA: Diagnosis not present

## 2019-01-10 DIAGNOSIS — Z7984 Long term (current) use of oral hypoglycemic drugs: Secondary | ICD-10-CM | POA: Diagnosis not present

## 2019-01-10 DIAGNOSIS — I509 Heart failure, unspecified: Secondary | ICD-10-CM | POA: Diagnosis not present

## 2019-01-10 DIAGNOSIS — G4733 Obstructive sleep apnea (adult) (pediatric): Secondary | ICD-10-CM | POA: Diagnosis not present

## 2019-01-10 MED ORDER — ENOXAPARIN SODIUM 40 MG/0.4ML ~~LOC~~ SOLN
40.00 | SUBCUTANEOUS | Status: DC
Start: 2019-01-10 — End: 2019-01-10

## 2019-01-10 MED ORDER — AMLODIPINE BESYLATE 5 MG PO TABS
5.00 | ORAL_TABLET | ORAL | Status: DC
Start: 2019-01-10 — End: 2019-01-10

## 2019-01-10 MED ORDER — GLIPIZIDE ER 2.5 MG PO TB24
2.50 | ORAL_TABLET | ORAL | Status: DC
Start: 2019-01-10 — End: 2019-01-10

## 2019-01-10 MED ORDER — BENZTROPINE MESYLATE 1 MG PO TABS
1.00 | ORAL_TABLET | ORAL | Status: DC
Start: 2019-01-09 — End: 2019-01-10

## 2019-01-10 MED ORDER — BUDESONIDE-FORMOTEROL FUMARATE 160-4.5 MCG/ACT IN AERO
2.00 | INHALATION_SPRAY | RESPIRATORY_TRACT | Status: DC
Start: ? — End: 2019-01-10

## 2019-01-10 MED ORDER — PANTOPRAZOLE SODIUM 40 MG PO TBEC
40.00 | DELAYED_RELEASE_TABLET | ORAL | Status: DC
Start: 2019-01-10 — End: 2019-01-10

## 2019-01-10 MED ORDER — ARIPIPRAZOLE 5 MG PO TABS
5.00 | ORAL_TABLET | ORAL | Status: DC
Start: 2019-01-10 — End: 2019-01-10

## 2019-01-10 MED ORDER — ASPIRIN 325 MG PO TABS
325.00 | ORAL_TABLET | ORAL | Status: DC
Start: 2019-01-10 — End: 2019-01-10

## 2019-01-10 MED ORDER — LISINOPRIL 20 MG PO TABS
20.00 | ORAL_TABLET | ORAL | Status: DC
Start: 2019-01-10 — End: 2019-01-10

## 2019-01-10 MED ORDER — BUSPIRONE HCL 15 MG PO TABS
15.00 | ORAL_TABLET | ORAL | Status: DC
Start: 2019-01-10 — End: 2019-01-10

## 2019-01-10 MED ORDER — ACETAMINOPHEN 325 MG PO TABS
650.00 | ORAL_TABLET | ORAL | Status: DC
Start: ? — End: 2019-01-10

## 2019-01-10 MED ORDER — ROSUVASTATIN CALCIUM 20 MG PO TABS
40.00 | ORAL_TABLET | ORAL | Status: DC
Start: 2019-01-09 — End: 2019-01-10

## 2019-01-10 MED ORDER — MINTEX CT 8 MG PO TB12
30.00 | ORAL_TABLET | ORAL | Status: DC
Start: ? — End: 2019-01-10

## 2019-01-10 MED ORDER — METOPROLOL SUCCINATE ER 25 MG PO TB24
25.00 | ORAL_TABLET | ORAL | Status: DC
Start: 2019-01-10 — End: 2019-01-10

## 2019-01-10 MED ORDER — SERTRALINE HCL 50 MG PO TABS
200.00 | ORAL_TABLET | ORAL | Status: DC
Start: 2019-01-09 — End: 2019-01-10

## 2019-01-10 MED ORDER — ALBUTEROL SULFATE (2.5 MG/3ML) 0.083% IN NEBU
2.50 | INHALATION_SOLUTION | RESPIRATORY_TRACT | Status: DC
Start: ? — End: 2019-01-10

## 2019-01-10 NOTE — Patient Outreach (Signed)
[place] [facility]) Care Management  01/10/2019  Christine Hebert 06/09/1956 [id number]   [place] Discipline Closure  Referral Date:  12/13/2018 Referral Source: [facility] Reason for Referral:  "Needs to lower her blood sugar" Insurance:  [employer] Attempt:  Upon chart review, patient did contact provider for telehealth appointment on last outreach.  Patient was sent to emergency room and eventually diagnosed with stroke. Discharged home from [facility] on 01/09/2019 with home health therapy.  Plan: RN Health Coach will place [facility] Community Nurse referral for increased acuity and Transition of Care outreaches.  [place] [phone] [name]

## 2019-01-11 ENCOUNTER — Other Ambulatory Visit: Payer: Self-pay | Admitting: *Deleted

## 2019-01-11 NOTE — Patient Outreach (Signed)
[place] [facility]) Care Management  01/11/2019  Christine Hebert 12/25/1955 [id number]  Referral received: 4/16 Initial outreach 4:17 [place] [facility] discharge-[facility]  Transition of care   RN initial outreach attempt unsuccessful. RN able to leave a HIPAA approved voice message requesting a call back. Will rescheduled another outreach within the next 4 days for pending services.   [name], RN Care Management Coordinator [place] Office [phone]

## 2019-01-15 DIAGNOSIS — G4733 Obstructive sleep apnea (adult) (pediatric): Secondary | ICD-10-CM | POA: Diagnosis not present

## 2019-01-15 DIAGNOSIS — K219 Gastro-esophageal reflux disease without esophagitis: Secondary | ICD-10-CM | POA: Diagnosis not present

## 2019-01-15 DIAGNOSIS — I252 Old myocardial infarction: Secondary | ICD-10-CM | POA: Diagnosis not present

## 2019-01-15 DIAGNOSIS — R51 Headache: Secondary | ICD-10-CM | POA: Diagnosis not present

## 2019-01-15 DIAGNOSIS — I509 Heart failure, unspecified: Secondary | ICD-10-CM | POA: Diagnosis not present

## 2019-01-15 DIAGNOSIS — J449 Chronic obstructive pulmonary disease, unspecified: Secondary | ICD-10-CM | POA: Diagnosis not present

## 2019-01-15 DIAGNOSIS — I2510 Atherosclerotic heart disease of native coronary artery without angina pectoris: Secondary | ICD-10-CM | POA: Diagnosis not present

## 2019-01-15 DIAGNOSIS — E785 Hyperlipidemia, unspecified: Secondary | ICD-10-CM | POA: Diagnosis not present

## 2019-01-15 DIAGNOSIS — D649 Anemia, unspecified: Secondary | ICD-10-CM | POA: Diagnosis not present

## 2019-01-15 DIAGNOSIS — I69398 Other sequelae of cerebral infarction: Secondary | ICD-10-CM | POA: Diagnosis not present

## 2019-01-15 DIAGNOSIS — I110 Hypertensive heart disease with heart failure: Secondary | ICD-10-CM | POA: Diagnosis not present

## 2019-01-15 DIAGNOSIS — E1140 Type 2 diabetes mellitus with diabetic neuropathy, unspecified: Secondary | ICD-10-CM | POA: Diagnosis not present

## 2019-01-15 DIAGNOSIS — R42 Dizziness and giddiness: Secondary | ICD-10-CM | POA: Diagnosis not present

## 2019-01-15 DIAGNOSIS — Z7984 Long term (current) use of oral hypoglycemic drugs: Secondary | ICD-10-CM | POA: Diagnosis not present

## 2019-01-16 ENCOUNTER — Other Ambulatory Visit: Payer: Self-pay | Admitting: *Deleted

## 2019-01-16 DIAGNOSIS — E118 Type 2 diabetes mellitus with unspecified complications: Secondary | ICD-10-CM | POA: Diagnosis not present

## 2019-01-16 DIAGNOSIS — H53002 Unspecified amblyopia, left eye: Secondary | ICD-10-CM | POA: Diagnosis not present

## 2019-01-16 DIAGNOSIS — I639 Cerebral infarction, unspecified: Secondary | ICD-10-CM | POA: Diagnosis not present

## 2019-01-16 DIAGNOSIS — I10 Essential (primary) hypertension: Secondary | ICD-10-CM | POA: Diagnosis not present

## 2019-01-16 NOTE — Patient Outreach (Signed)
[place] [facility]) Care Management  01/16/2019  Christine Hebert November 10, 1955 [id number]    Referral received: 01/10/2019 2nd Outreach:  01/16/2019   Transition of Care   SUBJECTIVE:  RN attempted outreach call today. Unsuccessful with a HIPAA approved voice message left requesting a call back.    PLAN: Will also send outreach letter and follow up within 4 days with another outreach call.  [name], RN Care Management Coordinator [place] Office [phone]

## 2019-01-17 DIAGNOSIS — H4901 Third [oculomotor] nerve palsy, right eye: Secondary | ICD-10-CM | POA: Diagnosis not present

## 2019-01-17 DIAGNOSIS — H04122 Dry eye syndrome of left lacrimal gland: Secondary | ICD-10-CM | POA: Diagnosis not present

## 2019-01-17 DIAGNOSIS — H53002 Unspecified amblyopia, left eye: Secondary | ICD-10-CM | POA: Diagnosis not present

## 2019-01-17 DIAGNOSIS — H21562 Pupillary abnormality, left eye: Secondary | ICD-10-CM | POA: Diagnosis not present

## 2019-01-17 DIAGNOSIS — G519 Disorder of facial nerve, unspecified: Secondary | ICD-10-CM | POA: Diagnosis not present

## 2019-01-17 DIAGNOSIS — I639 Cerebral infarction, unspecified: Secondary | ICD-10-CM | POA: Diagnosis not present

## 2019-01-21 DIAGNOSIS — I639 Cerebral infarction, unspecified: Secondary | ICD-10-CM | POA: Diagnosis not present

## 2019-01-22 ENCOUNTER — Other Ambulatory Visit: Payer: Self-pay | Admitting: *Deleted

## 2019-01-22 ENCOUNTER — Ambulatory Visit: Payer: Medicare Other | Admitting: *Deleted

## 2019-01-22 DIAGNOSIS — I509 Heart failure, unspecified: Secondary | ICD-10-CM | POA: Diagnosis not present

## 2019-01-22 DIAGNOSIS — K219 Gastro-esophageal reflux disease without esophagitis: Secondary | ICD-10-CM | POA: Diagnosis not present

## 2019-01-22 DIAGNOSIS — D649 Anemia, unspecified: Secondary | ICD-10-CM | POA: Diagnosis not present

## 2019-01-22 DIAGNOSIS — G4733 Obstructive sleep apnea (adult) (pediatric): Secondary | ICD-10-CM | POA: Diagnosis not present

## 2019-01-22 DIAGNOSIS — R51 Headache: Secondary | ICD-10-CM | POA: Diagnosis not present

## 2019-01-22 DIAGNOSIS — J449 Chronic obstructive pulmonary disease, unspecified: Secondary | ICD-10-CM | POA: Diagnosis not present

## 2019-01-22 DIAGNOSIS — Z7984 Long term (current) use of oral hypoglycemic drugs: Secondary | ICD-10-CM | POA: Diagnosis not present

## 2019-01-22 DIAGNOSIS — I69398 Other sequelae of cerebral infarction: Secondary | ICD-10-CM | POA: Diagnosis not present

## 2019-01-22 DIAGNOSIS — E1140 Type 2 diabetes mellitus with diabetic neuropathy, unspecified: Secondary | ICD-10-CM | POA: Diagnosis not present

## 2019-01-22 DIAGNOSIS — E785 Hyperlipidemia, unspecified: Secondary | ICD-10-CM | POA: Diagnosis not present

## 2019-01-22 DIAGNOSIS — I110 Hypertensive heart disease with heart failure: Secondary | ICD-10-CM | POA: Diagnosis not present

## 2019-01-22 DIAGNOSIS — I252 Old myocardial infarction: Secondary | ICD-10-CM | POA: Diagnosis not present

## 2019-01-22 DIAGNOSIS — I2510 Atherosclerotic heart disease of native coronary artery without angina pectoris: Secondary | ICD-10-CM | POA: Diagnosis not present

## 2019-01-22 DIAGNOSIS — R42 Dizziness and giddiness: Secondary | ICD-10-CM | POA: Diagnosis not present

## 2019-01-22 NOTE — Patient Outreach (Signed)
[place] [facility]) Care Management  01/22/2019  Christine Hebert December 13, 1955 [id number]    Transition of Care  RN attempted outreach to pt today and verified identifiers. RN explained the purpose for today's call. Pt was interrupted with a visitor and requested RN to call back. RN will follow up later today as requested to inquire further on pt's recent discharged and discharged review.  [name], RN Care Management Coordinator [place] Office [phone]

## 2019-01-23 DIAGNOSIS — K118 Other diseases of salivary glands: Secondary | ICD-10-CM | POA: Diagnosis not present

## 2019-01-24 DIAGNOSIS — I69398 Other sequelae of cerebral infarction: Secondary | ICD-10-CM | POA: Diagnosis not present

## 2019-01-24 DIAGNOSIS — I110 Hypertensive heart disease with heart failure: Secondary | ICD-10-CM | POA: Diagnosis not present

## 2019-01-24 DIAGNOSIS — K219 Gastro-esophageal reflux disease without esophagitis: Secondary | ICD-10-CM | POA: Diagnosis not present

## 2019-01-24 DIAGNOSIS — Z7984 Long term (current) use of oral hypoglycemic drugs: Secondary | ICD-10-CM | POA: Diagnosis not present

## 2019-01-24 DIAGNOSIS — R51 Headache: Secondary | ICD-10-CM | POA: Diagnosis not present

## 2019-01-24 DIAGNOSIS — G4733 Obstructive sleep apnea (adult) (pediatric): Secondary | ICD-10-CM | POA: Diagnosis not present

## 2019-01-24 DIAGNOSIS — D649 Anemia, unspecified: Secondary | ICD-10-CM | POA: Diagnosis not present

## 2019-01-24 DIAGNOSIS — I252 Old myocardial infarction: Secondary | ICD-10-CM | POA: Diagnosis not present

## 2019-01-24 DIAGNOSIS — E1140 Type 2 diabetes mellitus with diabetic neuropathy, unspecified: Secondary | ICD-10-CM | POA: Diagnosis not present

## 2019-01-24 DIAGNOSIS — J449 Chronic obstructive pulmonary disease, unspecified: Secondary | ICD-10-CM | POA: Diagnosis not present

## 2019-01-24 DIAGNOSIS — R42 Dizziness and giddiness: Secondary | ICD-10-CM | POA: Diagnosis not present

## 2019-01-24 DIAGNOSIS — I2510 Atherosclerotic heart disease of native coronary artery without angina pectoris: Secondary | ICD-10-CM | POA: Diagnosis not present

## 2019-01-24 DIAGNOSIS — E785 Hyperlipidemia, unspecified: Secondary | ICD-10-CM | POA: Diagnosis not present

## 2019-01-24 DIAGNOSIS — I509 Heart failure, unspecified: Secondary | ICD-10-CM | POA: Diagnosis not present

## 2019-01-25 ENCOUNTER — Other Ambulatory Visit: Payer: Self-pay | Admitting: *Deleted

## 2019-01-25 NOTE — Patient Outreach (Signed)
[place] [facility]) Care Management  01/25/2019  Christine Hebert October 23, 1955 [id number]    Unable to reach pt with several outreach calls and no response to the outreach letter.   Will close this case at this time and notify the provider of pt's disposition with [facility] services.  [name], RN Care Management Coordinator [place] Office [phone]

## 2019-01-29 DIAGNOSIS — I69398 Other sequelae of cerebral infarction: Secondary | ICD-10-CM | POA: Diagnosis not present

## 2019-01-29 DIAGNOSIS — R51 Headache: Secondary | ICD-10-CM | POA: Diagnosis not present

## 2019-01-29 DIAGNOSIS — J449 Chronic obstructive pulmonary disease, unspecified: Secondary | ICD-10-CM | POA: Diagnosis not present

## 2019-01-29 DIAGNOSIS — D649 Anemia, unspecified: Secondary | ICD-10-CM | POA: Diagnosis not present

## 2019-01-29 DIAGNOSIS — G4733 Obstructive sleep apnea (adult) (pediatric): Secondary | ICD-10-CM | POA: Diagnosis not present

## 2019-01-29 DIAGNOSIS — E785 Hyperlipidemia, unspecified: Secondary | ICD-10-CM | POA: Diagnosis not present

## 2019-01-29 DIAGNOSIS — I509 Heart failure, unspecified: Secondary | ICD-10-CM | POA: Diagnosis not present

## 2019-01-29 DIAGNOSIS — R42 Dizziness and giddiness: Secondary | ICD-10-CM | POA: Diagnosis not present

## 2019-01-29 DIAGNOSIS — I2510 Atherosclerotic heart disease of native coronary artery without angina pectoris: Secondary | ICD-10-CM | POA: Diagnosis not present

## 2019-01-29 DIAGNOSIS — I252 Old myocardial infarction: Secondary | ICD-10-CM | POA: Diagnosis not present

## 2019-01-29 DIAGNOSIS — Z7984 Long term (current) use of oral hypoglycemic drugs: Secondary | ICD-10-CM | POA: Diagnosis not present

## 2019-01-29 DIAGNOSIS — I110 Hypertensive heart disease with heart failure: Secondary | ICD-10-CM | POA: Diagnosis not present

## 2019-01-29 DIAGNOSIS — K219 Gastro-esophageal reflux disease without esophagitis: Secondary | ICD-10-CM | POA: Diagnosis not present

## 2019-01-29 DIAGNOSIS — E1140 Type 2 diabetes mellitus with diabetic neuropathy, unspecified: Secondary | ICD-10-CM | POA: Diagnosis not present

## 2019-01-30 DIAGNOSIS — J4520 Mild intermittent asthma, uncomplicated: Secondary | ICD-10-CM | POA: Diagnosis not present

## 2019-01-30 DIAGNOSIS — J42 Unspecified chronic bronchitis: Secondary | ICD-10-CM | POA: Diagnosis not present

## 2019-01-30 DIAGNOSIS — K118 Other diseases of salivary glands: Secondary | ICD-10-CM | POA: Diagnosis not present

## 2019-01-30 DIAGNOSIS — E0843 Diabetes mellitus due to underlying condition with diabetic autonomic (poly)neuropathy: Secondary | ICD-10-CM | POA: Diagnosis not present

## 2019-01-30 DIAGNOSIS — I639 Cerebral infarction, unspecified: Secondary | ICD-10-CM | POA: Diagnosis not present

## 2019-02-04 DIAGNOSIS — Z7984 Long term (current) use of oral hypoglycemic drugs: Secondary | ICD-10-CM | POA: Diagnosis not present

## 2019-02-04 DIAGNOSIS — E1169 Type 2 diabetes mellitus with other specified complication: Secondary | ICD-10-CM | POA: Diagnosis not present

## 2019-02-05 DIAGNOSIS — I252 Old myocardial infarction: Secondary | ICD-10-CM | POA: Diagnosis not present

## 2019-02-05 DIAGNOSIS — Z955 Presence of coronary angioplasty implant and graft: Secondary | ICD-10-CM | POA: Diagnosis not present

## 2019-02-05 DIAGNOSIS — Z79899 Other long term (current) drug therapy: Secondary | ICD-10-CM | POA: Diagnosis not present

## 2019-02-05 DIAGNOSIS — R42 Dizziness and giddiness: Secondary | ICD-10-CM | POA: Diagnosis not present

## 2019-02-05 DIAGNOSIS — I509 Heart failure, unspecified: Secondary | ICD-10-CM | POA: Diagnosis not present

## 2019-02-05 DIAGNOSIS — I69398 Other sequelae of cerebral infarction: Secondary | ICD-10-CM | POA: Diagnosis not present

## 2019-02-05 DIAGNOSIS — K219 Gastro-esophageal reflux disease without esophagitis: Secondary | ICD-10-CM | POA: Diagnosis not present

## 2019-02-05 DIAGNOSIS — I639 Cerebral infarction, unspecified: Secondary | ICD-10-CM | POA: Diagnosis not present

## 2019-02-05 DIAGNOSIS — G4733 Obstructive sleep apnea (adult) (pediatric): Secondary | ICD-10-CM | POA: Diagnosis not present

## 2019-02-05 DIAGNOSIS — E119 Type 2 diabetes mellitus without complications: Secondary | ICD-10-CM | POA: Diagnosis not present

## 2019-02-05 DIAGNOSIS — I2510 Atherosclerotic heart disease of native coronary artery without angina pectoris: Secondary | ICD-10-CM | POA: Diagnosis not present

## 2019-02-05 DIAGNOSIS — I110 Hypertensive heart disease with heart failure: Secondary | ICD-10-CM | POA: Diagnosis not present

## 2019-02-05 DIAGNOSIS — E0842 Diabetes mellitus due to underlying condition with diabetic polyneuropathy: Secondary | ICD-10-CM | POA: Diagnosis not present

## 2019-02-05 DIAGNOSIS — M170 Bilateral primary osteoarthritis of knee: Secondary | ICD-10-CM | POA: Diagnosis not present

## 2019-02-05 DIAGNOSIS — Z8709 Personal history of other diseases of the respiratory system: Secondary | ICD-10-CM | POA: Diagnosis not present

## 2019-02-05 DIAGNOSIS — E785 Hyperlipidemia, unspecified: Secondary | ICD-10-CM | POA: Diagnosis not present

## 2019-02-05 DIAGNOSIS — D649 Anemia, unspecified: Secondary | ICD-10-CM | POA: Diagnosis not present

## 2019-02-05 DIAGNOSIS — R51 Headache: Secondary | ICD-10-CM | POA: Diagnosis not present

## 2019-02-05 DIAGNOSIS — Z7984 Long term (current) use of oral hypoglycemic drugs: Secondary | ICD-10-CM | POA: Diagnosis not present

## 2019-02-05 DIAGNOSIS — I10 Essential (primary) hypertension: Secondary | ICD-10-CM | POA: Diagnosis not present

## 2019-02-05 DIAGNOSIS — I259 Chronic ischemic heart disease, unspecified: Secondary | ICD-10-CM | POA: Diagnosis not present

## 2019-02-05 DIAGNOSIS — K118 Other diseases of salivary glands: Secondary | ICD-10-CM | POA: Diagnosis not present

## 2019-02-05 DIAGNOSIS — R079 Chest pain, unspecified: Secondary | ICD-10-CM | POA: Diagnosis not present

## 2019-02-05 DIAGNOSIS — E1140 Type 2 diabetes mellitus with diabetic neuropathy, unspecified: Secondary | ICD-10-CM | POA: Diagnosis not present

## 2019-02-05 DIAGNOSIS — J449 Chronic obstructive pulmonary disease, unspecified: Secondary | ICD-10-CM | POA: Diagnosis not present

## 2019-02-07 DIAGNOSIS — N950 Postmenopausal bleeding: Secondary | ICD-10-CM | POA: Diagnosis not present

## 2019-02-12 DIAGNOSIS — E785 Hyperlipidemia, unspecified: Secondary | ICD-10-CM | POA: Diagnosis not present

## 2019-02-12 DIAGNOSIS — I10 Essential (primary) hypertension: Secondary | ICD-10-CM | POA: Diagnosis not present

## 2019-02-12 DIAGNOSIS — I252 Old myocardial infarction: Secondary | ICD-10-CM | POA: Diagnosis not present

## 2019-02-12 DIAGNOSIS — R079 Chest pain, unspecified: Secondary | ICD-10-CM | POA: Diagnosis not present

## 2019-02-12 DIAGNOSIS — I259 Chronic ischemic heart disease, unspecified: Secondary | ICD-10-CM | POA: Diagnosis not present

## 2019-02-14 DIAGNOSIS — R42 Dizziness and giddiness: Secondary | ICD-10-CM | POA: Diagnosis not present

## 2019-02-14 DIAGNOSIS — I110 Hypertensive heart disease with heart failure: Secondary | ICD-10-CM | POA: Diagnosis not present

## 2019-02-14 DIAGNOSIS — I2510 Atherosclerotic heart disease of native coronary artery without angina pectoris: Secondary | ICD-10-CM | POA: Diagnosis not present

## 2019-02-14 DIAGNOSIS — I69398 Other sequelae of cerebral infarction: Secondary | ICD-10-CM | POA: Diagnosis not present

## 2019-02-14 DIAGNOSIS — J449 Chronic obstructive pulmonary disease, unspecified: Secondary | ICD-10-CM | POA: Diagnosis not present

## 2019-02-14 DIAGNOSIS — I252 Old myocardial infarction: Secondary | ICD-10-CM | POA: Diagnosis not present

## 2019-02-14 DIAGNOSIS — Z7984 Long term (current) use of oral hypoglycemic drugs: Secondary | ICD-10-CM | POA: Diagnosis not present

## 2019-02-14 DIAGNOSIS — D649 Anemia, unspecified: Secondary | ICD-10-CM | POA: Diagnosis not present

## 2019-02-14 DIAGNOSIS — K219 Gastro-esophageal reflux disease without esophagitis: Secondary | ICD-10-CM | POA: Diagnosis not present

## 2019-02-14 DIAGNOSIS — E785 Hyperlipidemia, unspecified: Secondary | ICD-10-CM | POA: Diagnosis not present

## 2019-02-14 DIAGNOSIS — R51 Headache: Secondary | ICD-10-CM | POA: Diagnosis not present

## 2019-02-14 DIAGNOSIS — G4733 Obstructive sleep apnea (adult) (pediatric): Secondary | ICD-10-CM | POA: Diagnosis not present

## 2019-02-14 DIAGNOSIS — I509 Heart failure, unspecified: Secondary | ICD-10-CM | POA: Diagnosis not present

## 2019-02-14 DIAGNOSIS — E1140 Type 2 diabetes mellitus with diabetic neuropathy, unspecified: Secondary | ICD-10-CM | POA: Diagnosis not present

## 2019-02-19 DIAGNOSIS — Z79899 Other long term (current) drug therapy: Secondary | ICD-10-CM | POA: Diagnosis not present

## 2019-02-20 DIAGNOSIS — I2510 Atherosclerotic heart disease of native coronary artery without angina pectoris: Secondary | ICD-10-CM | POA: Diagnosis not present

## 2019-02-20 DIAGNOSIS — R51 Headache: Secondary | ICD-10-CM | POA: Diagnosis not present

## 2019-02-20 DIAGNOSIS — G4733 Obstructive sleep apnea (adult) (pediatric): Secondary | ICD-10-CM | POA: Diagnosis not present

## 2019-02-20 DIAGNOSIS — E1140 Type 2 diabetes mellitus with diabetic neuropathy, unspecified: Secondary | ICD-10-CM | POA: Diagnosis not present

## 2019-02-20 DIAGNOSIS — I252 Old myocardial infarction: Secondary | ICD-10-CM | POA: Diagnosis not present

## 2019-02-20 DIAGNOSIS — I509 Heart failure, unspecified: Secondary | ICD-10-CM | POA: Diagnosis not present

## 2019-02-20 DIAGNOSIS — E785 Hyperlipidemia, unspecified: Secondary | ICD-10-CM | POA: Diagnosis not present

## 2019-02-20 DIAGNOSIS — J449 Chronic obstructive pulmonary disease, unspecified: Secondary | ICD-10-CM | POA: Diagnosis not present

## 2019-02-20 DIAGNOSIS — Z7984 Long term (current) use of oral hypoglycemic drugs: Secondary | ICD-10-CM | POA: Diagnosis not present

## 2019-02-20 DIAGNOSIS — K219 Gastro-esophageal reflux disease without esophagitis: Secondary | ICD-10-CM | POA: Diagnosis not present

## 2019-02-20 DIAGNOSIS — D649 Anemia, unspecified: Secondary | ICD-10-CM | POA: Diagnosis not present

## 2019-02-20 DIAGNOSIS — I110 Hypertensive heart disease with heart failure: Secondary | ICD-10-CM | POA: Diagnosis not present

## 2019-02-20 DIAGNOSIS — I69398 Other sequelae of cerebral infarction: Secondary | ICD-10-CM | POA: Diagnosis not present

## 2019-02-20 DIAGNOSIS — R42 Dizziness and giddiness: Secondary | ICD-10-CM | POA: Diagnosis not present

## 2019-02-21 DIAGNOSIS — G4733 Obstructive sleep apnea (adult) (pediatric): Secondary | ICD-10-CM | POA: Diagnosis not present

## 2019-02-22 DIAGNOSIS — N950 Postmenopausal bleeding: Secondary | ICD-10-CM | POA: Diagnosis not present

## 2019-02-27 DIAGNOSIS — R42 Dizziness and giddiness: Secondary | ICD-10-CM | POA: Diagnosis not present

## 2019-02-27 DIAGNOSIS — I509 Heart failure, unspecified: Secondary | ICD-10-CM | POA: Diagnosis not present

## 2019-02-27 DIAGNOSIS — I2510 Atherosclerotic heart disease of native coronary artery without angina pectoris: Secondary | ICD-10-CM | POA: Diagnosis not present

## 2019-02-27 DIAGNOSIS — D649 Anemia, unspecified: Secondary | ICD-10-CM | POA: Diagnosis not present

## 2019-02-27 DIAGNOSIS — Z7984 Long term (current) use of oral hypoglycemic drugs: Secondary | ICD-10-CM | POA: Diagnosis not present

## 2019-02-27 DIAGNOSIS — J449 Chronic obstructive pulmonary disease, unspecified: Secondary | ICD-10-CM | POA: Diagnosis not present

## 2019-02-27 DIAGNOSIS — I69398 Other sequelae of cerebral infarction: Secondary | ICD-10-CM | POA: Diagnosis not present

## 2019-02-27 DIAGNOSIS — K219 Gastro-esophageal reflux disease without esophagitis: Secondary | ICD-10-CM | POA: Diagnosis not present

## 2019-02-27 DIAGNOSIS — I252 Old myocardial infarction: Secondary | ICD-10-CM | POA: Diagnosis not present

## 2019-02-27 DIAGNOSIS — I110 Hypertensive heart disease with heart failure: Secondary | ICD-10-CM | POA: Diagnosis not present

## 2019-02-27 DIAGNOSIS — E1140 Type 2 diabetes mellitus with diabetic neuropathy, unspecified: Secondary | ICD-10-CM | POA: Diagnosis not present

## 2019-02-27 DIAGNOSIS — E785 Hyperlipidemia, unspecified: Secondary | ICD-10-CM | POA: Diagnosis not present

## 2019-02-27 DIAGNOSIS — G4733 Obstructive sleep apnea (adult) (pediatric): Secondary | ICD-10-CM | POA: Diagnosis not present

## 2019-02-27 DIAGNOSIS — R51 Headache: Secondary | ICD-10-CM | POA: Diagnosis not present

## 2019-02-28 DIAGNOSIS — R51 Headache: Secondary | ICD-10-CM | POA: Diagnosis not present

## 2019-02-28 DIAGNOSIS — Z7984 Long term (current) use of oral hypoglycemic drugs: Secondary | ICD-10-CM | POA: Diagnosis not present

## 2019-02-28 DIAGNOSIS — E785 Hyperlipidemia, unspecified: Secondary | ICD-10-CM | POA: Diagnosis not present

## 2019-02-28 DIAGNOSIS — G4733 Obstructive sleep apnea (adult) (pediatric): Secondary | ICD-10-CM | POA: Diagnosis not present

## 2019-02-28 DIAGNOSIS — E1140 Type 2 diabetes mellitus with diabetic neuropathy, unspecified: Secondary | ICD-10-CM | POA: Diagnosis not present

## 2019-02-28 DIAGNOSIS — I110 Hypertensive heart disease with heart failure: Secondary | ICD-10-CM | POA: Diagnosis not present

## 2019-02-28 DIAGNOSIS — D649 Anemia, unspecified: Secondary | ICD-10-CM | POA: Diagnosis not present

## 2019-02-28 DIAGNOSIS — I509 Heart failure, unspecified: Secondary | ICD-10-CM | POA: Diagnosis not present

## 2019-02-28 DIAGNOSIS — K118 Other diseases of salivary glands: Secondary | ICD-10-CM | POA: Diagnosis not present

## 2019-02-28 DIAGNOSIS — I69398 Other sequelae of cerebral infarction: Secondary | ICD-10-CM | POA: Diagnosis not present

## 2019-02-28 DIAGNOSIS — J449 Chronic obstructive pulmonary disease, unspecified: Secondary | ICD-10-CM | POA: Diagnosis not present

## 2019-02-28 DIAGNOSIS — I2510 Atherosclerotic heart disease of native coronary artery without angina pectoris: Secondary | ICD-10-CM | POA: Diagnosis not present

## 2019-02-28 DIAGNOSIS — R42 Dizziness and giddiness: Secondary | ICD-10-CM | POA: Diagnosis not present

## 2019-02-28 DIAGNOSIS — K219 Gastro-esophageal reflux disease without esophagitis: Secondary | ICD-10-CM | POA: Diagnosis not present

## 2019-02-28 DIAGNOSIS — I252 Old myocardial infarction: Secondary | ICD-10-CM | POA: Diagnosis not present

## 2019-03-01 DIAGNOSIS — N950 Postmenopausal bleeding: Secondary | ICD-10-CM | POA: Diagnosis not present

## 2019-03-01 DIAGNOSIS — Z124 Encounter for screening for malignant neoplasm of cervix: Secondary | ICD-10-CM | POA: Diagnosis not present

## 2019-03-05 DIAGNOSIS — R946 Abnormal results of thyroid function studies: Secondary | ICD-10-CM | POA: Diagnosis not present

## 2019-03-05 DIAGNOSIS — J449 Chronic obstructive pulmonary disease, unspecified: Secondary | ICD-10-CM | POA: Diagnosis not present

## 2019-03-05 DIAGNOSIS — Z79899 Other long term (current) drug therapy: Secondary | ICD-10-CM | POA: Diagnosis not present

## 2019-03-05 DIAGNOSIS — I10 Essential (primary) hypertension: Secondary | ICD-10-CM | POA: Diagnosis not present

## 2019-03-05 DIAGNOSIS — I639 Cerebral infarction, unspecified: Secondary | ICD-10-CM | POA: Diagnosis not present

## 2019-03-05 DIAGNOSIS — G4733 Obstructive sleep apnea (adult) (pediatric): Secondary | ICD-10-CM | POA: Diagnosis not present

## 2019-03-05 DIAGNOSIS — Z7982 Long term (current) use of aspirin: Secondary | ICD-10-CM | POA: Diagnosis not present

## 2019-03-05 DIAGNOSIS — E1140 Type 2 diabetes mellitus with diabetic neuropathy, unspecified: Secondary | ICD-10-CM | POA: Diagnosis not present

## 2019-03-05 DIAGNOSIS — Z09 Encounter for follow-up examination after completed treatment for conditions other than malignant neoplasm: Secondary | ICD-10-CM | POA: Diagnosis not present

## 2019-03-05 DIAGNOSIS — E785 Hyperlipidemia, unspecified: Secondary | ICD-10-CM | POA: Diagnosis not present

## 2019-03-05 DIAGNOSIS — I252 Old myocardial infarction: Secondary | ICD-10-CM | POA: Diagnosis not present

## 2019-03-05 DIAGNOSIS — Z7984 Long term (current) use of oral hypoglycemic drugs: Secondary | ICD-10-CM | POA: Diagnosis not present

## 2019-03-07 DIAGNOSIS — I252 Old myocardial infarction: Secondary | ICD-10-CM | POA: Diagnosis not present

## 2019-03-07 DIAGNOSIS — G4733 Obstructive sleep apnea (adult) (pediatric): Secondary | ICD-10-CM | POA: Diagnosis not present

## 2019-03-07 DIAGNOSIS — I69398 Other sequelae of cerebral infarction: Secondary | ICD-10-CM | POA: Diagnosis not present

## 2019-03-07 DIAGNOSIS — K219 Gastro-esophageal reflux disease without esophagitis: Secondary | ICD-10-CM | POA: Diagnosis not present

## 2019-03-07 DIAGNOSIS — E785 Hyperlipidemia, unspecified: Secondary | ICD-10-CM | POA: Diagnosis not present

## 2019-03-07 DIAGNOSIS — I110 Hypertensive heart disease with heart failure: Secondary | ICD-10-CM | POA: Diagnosis not present

## 2019-03-07 DIAGNOSIS — E1140 Type 2 diabetes mellitus with diabetic neuropathy, unspecified: Secondary | ICD-10-CM | POA: Diagnosis not present

## 2019-03-07 DIAGNOSIS — Z7984 Long term (current) use of oral hypoglycemic drugs: Secondary | ICD-10-CM | POA: Diagnosis not present

## 2019-03-07 DIAGNOSIS — R42 Dizziness and giddiness: Secondary | ICD-10-CM | POA: Diagnosis not present

## 2019-03-07 DIAGNOSIS — D649 Anemia, unspecified: Secondary | ICD-10-CM | POA: Diagnosis not present

## 2019-03-07 DIAGNOSIS — I2510 Atherosclerotic heart disease of native coronary artery without angina pectoris: Secondary | ICD-10-CM | POA: Diagnosis not present

## 2019-03-07 DIAGNOSIS — J449 Chronic obstructive pulmonary disease, unspecified: Secondary | ICD-10-CM | POA: Diagnosis not present

## 2019-03-07 DIAGNOSIS — R51 Headache: Secondary | ICD-10-CM | POA: Diagnosis not present

## 2019-03-07 DIAGNOSIS — I509 Heart failure, unspecified: Secondary | ICD-10-CM | POA: Diagnosis not present

## 2019-03-08 DIAGNOSIS — M170 Bilateral primary osteoarthritis of knee: Secondary | ICD-10-CM | POA: Diagnosis not present

## 2019-03-08 DIAGNOSIS — G4733 Obstructive sleep apnea (adult) (pediatric): Secondary | ICD-10-CM | POA: Diagnosis not present

## 2019-03-08 DIAGNOSIS — E0842 Diabetes mellitus due to underlying condition with diabetic polyneuropathy: Secondary | ICD-10-CM | POA: Diagnosis not present

## 2019-03-13 DIAGNOSIS — G4733 Obstructive sleep apnea (adult) (pediatric): Secondary | ICD-10-CM | POA: Diagnosis not present

## 2019-03-13 DIAGNOSIS — E1140 Type 2 diabetes mellitus with diabetic neuropathy, unspecified: Secondary | ICD-10-CM | POA: Diagnosis not present

## 2019-03-13 DIAGNOSIS — E785 Hyperlipidemia, unspecified: Secondary | ICD-10-CM | POA: Diagnosis not present

## 2019-03-13 DIAGNOSIS — K219 Gastro-esophageal reflux disease without esophagitis: Secondary | ICD-10-CM | POA: Diagnosis not present

## 2019-03-13 DIAGNOSIS — R42 Dizziness and giddiness: Secondary | ICD-10-CM | POA: Diagnosis not present

## 2019-03-13 DIAGNOSIS — I509 Heart failure, unspecified: Secondary | ICD-10-CM | POA: Diagnosis not present

## 2019-03-13 DIAGNOSIS — D649 Anemia, unspecified: Secondary | ICD-10-CM | POA: Diagnosis not present

## 2019-03-13 DIAGNOSIS — I110 Hypertensive heart disease with heart failure: Secondary | ICD-10-CM | POA: Diagnosis not present

## 2019-03-13 DIAGNOSIS — Z7984 Long term (current) use of oral hypoglycemic drugs: Secondary | ICD-10-CM | POA: Diagnosis not present

## 2019-03-13 DIAGNOSIS — I69398 Other sequelae of cerebral infarction: Secondary | ICD-10-CM | POA: Diagnosis not present

## 2019-03-13 DIAGNOSIS — J449 Chronic obstructive pulmonary disease, unspecified: Secondary | ICD-10-CM | POA: Diagnosis not present

## 2019-03-13 DIAGNOSIS — R51 Headache: Secondary | ICD-10-CM | POA: Diagnosis not present

## 2019-03-13 DIAGNOSIS — I2510 Atherosclerotic heart disease of native coronary artery without angina pectoris: Secondary | ICD-10-CM | POA: Diagnosis not present

## 2019-03-13 DIAGNOSIS — I252 Old myocardial infarction: Secondary | ICD-10-CM | POA: Diagnosis not present

## 2019-03-20 DIAGNOSIS — K219 Gastro-esophageal reflux disease without esophagitis: Secondary | ICD-10-CM | POA: Diagnosis not present

## 2019-03-20 DIAGNOSIS — G4733 Obstructive sleep apnea (adult) (pediatric): Secondary | ICD-10-CM | POA: Diagnosis not present

## 2019-03-20 DIAGNOSIS — R42 Dizziness and giddiness: Secondary | ICD-10-CM | POA: Diagnosis not present

## 2019-03-20 DIAGNOSIS — I69398 Other sequelae of cerebral infarction: Secondary | ICD-10-CM | POA: Diagnosis not present

## 2019-03-20 DIAGNOSIS — I252 Old myocardial infarction: Secondary | ICD-10-CM | POA: Diagnosis not present

## 2019-03-20 DIAGNOSIS — D649 Anemia, unspecified: Secondary | ICD-10-CM | POA: Diagnosis not present

## 2019-03-20 DIAGNOSIS — I509 Heart failure, unspecified: Secondary | ICD-10-CM | POA: Diagnosis not present

## 2019-03-20 DIAGNOSIS — I2510 Atherosclerotic heart disease of native coronary artery without angina pectoris: Secondary | ICD-10-CM | POA: Diagnosis not present

## 2019-03-20 DIAGNOSIS — I110 Hypertensive heart disease with heart failure: Secondary | ICD-10-CM | POA: Diagnosis not present

## 2019-03-20 DIAGNOSIS — Z7984 Long term (current) use of oral hypoglycemic drugs: Secondary | ICD-10-CM | POA: Diagnosis not present

## 2019-03-20 DIAGNOSIS — E785 Hyperlipidemia, unspecified: Secondary | ICD-10-CM | POA: Diagnosis not present

## 2019-03-20 DIAGNOSIS — R51 Headache: Secondary | ICD-10-CM | POA: Diagnosis not present

## 2019-03-20 DIAGNOSIS — J449 Chronic obstructive pulmonary disease, unspecified: Secondary | ICD-10-CM | POA: Diagnosis not present

## 2019-03-20 DIAGNOSIS — E1140 Type 2 diabetes mellitus with diabetic neuropathy, unspecified: Secondary | ICD-10-CM | POA: Diagnosis not present

## 2019-03-24 DIAGNOSIS — I69398 Other sequelae of cerebral infarction: Secondary | ICD-10-CM | POA: Diagnosis not present

## 2019-03-24 DIAGNOSIS — R51 Headache: Secondary | ICD-10-CM | POA: Diagnosis not present

## 2019-03-24 DIAGNOSIS — I509 Heart failure, unspecified: Secondary | ICD-10-CM | POA: Diagnosis not present

## 2019-03-24 DIAGNOSIS — R42 Dizziness and giddiness: Secondary | ICD-10-CM | POA: Diagnosis not present

## 2019-03-24 DIAGNOSIS — I110 Hypertensive heart disease with heart failure: Secondary | ICD-10-CM | POA: Diagnosis not present

## 2019-03-25 DIAGNOSIS — G519 Disorder of facial nerve, unspecified: Secondary | ICD-10-CM | POA: Diagnosis not present

## 2019-03-25 DIAGNOSIS — H04122 Dry eye syndrome of left lacrimal gland: Secondary | ICD-10-CM | POA: Diagnosis not present

## 2019-03-25 DIAGNOSIS — H02401 Unspecified ptosis of right eyelid: Secondary | ICD-10-CM | POA: Diagnosis not present

## 2019-03-25 DIAGNOSIS — H02206 Unspecified lagophthalmos left eye, unspecified eyelid: Secondary | ICD-10-CM | POA: Diagnosis not present

## 2019-03-25 DIAGNOSIS — H25813 Combined forms of age-related cataract, bilateral: Secondary | ICD-10-CM | POA: Diagnosis not present

## 2019-03-25 DIAGNOSIS — H21562 Pupillary abnormality, left eye: Secondary | ICD-10-CM | POA: Diagnosis not present

## 2019-03-25 DIAGNOSIS — H4901 Third [oculomotor] nerve palsy, right eye: Secondary | ICD-10-CM | POA: Diagnosis not present

## 2019-03-25 DIAGNOSIS — I639 Cerebral infarction, unspecified: Secondary | ICD-10-CM | POA: Diagnosis not present

## 2019-03-25 DIAGNOSIS — H53002 Unspecified amblyopia, left eye: Secondary | ICD-10-CM | POA: Diagnosis not present

## 2019-03-27 DIAGNOSIS — I509 Heart failure, unspecified: Secondary | ICD-10-CM | POA: Diagnosis not present

## 2019-03-27 DIAGNOSIS — R51 Headache: Secondary | ICD-10-CM | POA: Diagnosis not present

## 2019-03-27 DIAGNOSIS — R42 Dizziness and giddiness: Secondary | ICD-10-CM | POA: Diagnosis not present

## 2019-03-27 DIAGNOSIS — I110 Hypertensive heart disease with heart failure: Secondary | ICD-10-CM | POA: Diagnosis not present

## 2019-03-27 DIAGNOSIS — D649 Anemia, unspecified: Secondary | ICD-10-CM | POA: Diagnosis not present

## 2019-03-27 DIAGNOSIS — I2510 Atherosclerotic heart disease of native coronary artery without angina pectoris: Secondary | ICD-10-CM | POA: Diagnosis not present

## 2019-03-27 DIAGNOSIS — G4733 Obstructive sleep apnea (adult) (pediatric): Secondary | ICD-10-CM | POA: Diagnosis not present

## 2019-03-27 DIAGNOSIS — J449 Chronic obstructive pulmonary disease, unspecified: Secondary | ICD-10-CM | POA: Diagnosis not present

## 2019-03-27 DIAGNOSIS — E785 Hyperlipidemia, unspecified: Secondary | ICD-10-CM | POA: Diagnosis not present

## 2019-03-27 DIAGNOSIS — I252 Old myocardial infarction: Secondary | ICD-10-CM | POA: Diagnosis not present

## 2019-03-27 DIAGNOSIS — I69398 Other sequelae of cerebral infarction: Secondary | ICD-10-CM | POA: Diagnosis not present

## 2019-03-27 DIAGNOSIS — K219 Gastro-esophageal reflux disease without esophagitis: Secondary | ICD-10-CM | POA: Diagnosis not present

## 2019-03-27 DIAGNOSIS — Z7984 Long term (current) use of oral hypoglycemic drugs: Secondary | ICD-10-CM | POA: Diagnosis not present

## 2019-03-27 DIAGNOSIS — E1140 Type 2 diabetes mellitus with diabetic neuropathy, unspecified: Secondary | ICD-10-CM | POA: Diagnosis not present

## 2019-04-03 DIAGNOSIS — I509 Heart failure, unspecified: Secondary | ICD-10-CM | POA: Diagnosis not present

## 2019-04-03 DIAGNOSIS — K219 Gastro-esophageal reflux disease without esophagitis: Secondary | ICD-10-CM | POA: Diagnosis not present

## 2019-04-03 DIAGNOSIS — I69398 Other sequelae of cerebral infarction: Secondary | ICD-10-CM | POA: Diagnosis not present

## 2019-04-03 DIAGNOSIS — I110 Hypertensive heart disease with heart failure: Secondary | ICD-10-CM | POA: Diagnosis not present

## 2019-04-03 DIAGNOSIS — Z7984 Long term (current) use of oral hypoglycemic drugs: Secondary | ICD-10-CM | POA: Diagnosis not present

## 2019-04-03 DIAGNOSIS — R42 Dizziness and giddiness: Secondary | ICD-10-CM | POA: Diagnosis not present

## 2019-04-03 DIAGNOSIS — R51 Headache: Secondary | ICD-10-CM | POA: Diagnosis not present

## 2019-04-03 DIAGNOSIS — E1140 Type 2 diabetes mellitus with diabetic neuropathy, unspecified: Secondary | ICD-10-CM | POA: Diagnosis not present

## 2019-04-03 DIAGNOSIS — J449 Chronic obstructive pulmonary disease, unspecified: Secondary | ICD-10-CM | POA: Diagnosis not present

## 2019-04-03 DIAGNOSIS — D649 Anemia, unspecified: Secondary | ICD-10-CM | POA: Diagnosis not present

## 2019-04-03 DIAGNOSIS — I2510 Atherosclerotic heart disease of native coronary artery without angina pectoris: Secondary | ICD-10-CM | POA: Diagnosis not present

## 2019-04-03 DIAGNOSIS — G4733 Obstructive sleep apnea (adult) (pediatric): Secondary | ICD-10-CM | POA: Diagnosis not present

## 2019-04-03 DIAGNOSIS — I252 Old myocardial infarction: Secondary | ICD-10-CM | POA: Diagnosis not present

## 2019-04-03 DIAGNOSIS — E785 Hyperlipidemia, unspecified: Secondary | ICD-10-CM | POA: Diagnosis not present

## 2019-04-07 DIAGNOSIS — G4733 Obstructive sleep apnea (adult) (pediatric): Secondary | ICD-10-CM | POA: Diagnosis not present

## 2019-04-07 DIAGNOSIS — M170 Bilateral primary osteoarthritis of knee: Secondary | ICD-10-CM | POA: Diagnosis not present

## 2019-04-07 DIAGNOSIS — E0842 Diabetes mellitus due to underlying condition with diabetic polyneuropathy: Secondary | ICD-10-CM | POA: Diagnosis not present

## 2019-04-16 DIAGNOSIS — E1140 Type 2 diabetes mellitus with diabetic neuropathy, unspecified: Secondary | ICD-10-CM | POA: Diagnosis not present

## 2019-04-16 DIAGNOSIS — I69398 Other sequelae of cerebral infarction: Secondary | ICD-10-CM | POA: Diagnosis not present

## 2019-04-16 DIAGNOSIS — D649 Anemia, unspecified: Secondary | ICD-10-CM | POA: Diagnosis not present

## 2019-04-16 DIAGNOSIS — I252 Old myocardial infarction: Secondary | ICD-10-CM | POA: Diagnosis not present

## 2019-04-16 DIAGNOSIS — G4733 Obstructive sleep apnea (adult) (pediatric): Secondary | ICD-10-CM | POA: Diagnosis not present

## 2019-04-16 DIAGNOSIS — Z7984 Long term (current) use of oral hypoglycemic drugs: Secondary | ICD-10-CM | POA: Diagnosis not present

## 2019-04-16 DIAGNOSIS — R51 Headache: Secondary | ICD-10-CM | POA: Diagnosis not present

## 2019-04-16 DIAGNOSIS — K219 Gastro-esophageal reflux disease without esophagitis: Secondary | ICD-10-CM | POA: Diagnosis not present

## 2019-04-16 DIAGNOSIS — I110 Hypertensive heart disease with heart failure: Secondary | ICD-10-CM | POA: Diagnosis not present

## 2019-04-16 DIAGNOSIS — E785 Hyperlipidemia, unspecified: Secondary | ICD-10-CM | POA: Diagnosis not present

## 2019-04-16 DIAGNOSIS — I2510 Atherosclerotic heart disease of native coronary artery without angina pectoris: Secondary | ICD-10-CM | POA: Diagnosis not present

## 2019-04-16 DIAGNOSIS — J449 Chronic obstructive pulmonary disease, unspecified: Secondary | ICD-10-CM | POA: Diagnosis not present

## 2019-04-16 DIAGNOSIS — I509 Heart failure, unspecified: Secondary | ICD-10-CM | POA: Diagnosis not present

## 2019-04-16 DIAGNOSIS — R42 Dizziness and giddiness: Secondary | ICD-10-CM | POA: Diagnosis not present

## 2019-04-26 DIAGNOSIS — I509 Heart failure, unspecified: Secondary | ICD-10-CM | POA: Diagnosis not present

## 2019-04-26 DIAGNOSIS — I110 Hypertensive heart disease with heart failure: Secondary | ICD-10-CM | POA: Diagnosis not present

## 2019-04-26 DIAGNOSIS — I2510 Atherosclerotic heart disease of native coronary artery without angina pectoris: Secondary | ICD-10-CM | POA: Diagnosis not present

## 2019-04-26 DIAGNOSIS — D649 Anemia, unspecified: Secondary | ICD-10-CM | POA: Diagnosis not present

## 2019-04-26 DIAGNOSIS — E785 Hyperlipidemia, unspecified: Secondary | ICD-10-CM | POA: Diagnosis not present

## 2019-04-26 DIAGNOSIS — G4733 Obstructive sleep apnea (adult) (pediatric): Secondary | ICD-10-CM | POA: Diagnosis not present

## 2019-04-26 DIAGNOSIS — E1140 Type 2 diabetes mellitus with diabetic neuropathy, unspecified: Secondary | ICD-10-CM | POA: Diagnosis not present

## 2019-04-26 DIAGNOSIS — R51 Headache: Secondary | ICD-10-CM | POA: Diagnosis not present

## 2019-04-26 DIAGNOSIS — I252 Old myocardial infarction: Secondary | ICD-10-CM | POA: Diagnosis not present

## 2019-04-26 DIAGNOSIS — R42 Dizziness and giddiness: Secondary | ICD-10-CM | POA: Diagnosis not present

## 2019-04-26 DIAGNOSIS — I69398 Other sequelae of cerebral infarction: Secondary | ICD-10-CM | POA: Diagnosis not present

## 2019-04-26 DIAGNOSIS — K219 Gastro-esophageal reflux disease without esophagitis: Secondary | ICD-10-CM | POA: Diagnosis not present

## 2019-04-26 DIAGNOSIS — Z7984 Long term (current) use of oral hypoglycemic drugs: Secondary | ICD-10-CM | POA: Diagnosis not present

## 2019-04-26 DIAGNOSIS — J449 Chronic obstructive pulmonary disease, unspecified: Secondary | ICD-10-CM | POA: Diagnosis not present

## 2019-05-01 DIAGNOSIS — K219 Gastro-esophageal reflux disease without esophagitis: Secondary | ICD-10-CM | POA: Diagnosis not present

## 2019-05-01 DIAGNOSIS — I110 Hypertensive heart disease with heart failure: Secondary | ICD-10-CM | POA: Diagnosis not present

## 2019-05-01 DIAGNOSIS — I2510 Atherosclerotic heart disease of native coronary artery without angina pectoris: Secondary | ICD-10-CM | POA: Diagnosis not present

## 2019-05-01 DIAGNOSIS — I252 Old myocardial infarction: Secondary | ICD-10-CM | POA: Diagnosis not present

## 2019-05-01 DIAGNOSIS — R51 Headache: Secondary | ICD-10-CM | POA: Diagnosis not present

## 2019-05-01 DIAGNOSIS — G4733 Obstructive sleep apnea (adult) (pediatric): Secondary | ICD-10-CM | POA: Diagnosis not present

## 2019-05-01 DIAGNOSIS — J449 Chronic obstructive pulmonary disease, unspecified: Secondary | ICD-10-CM | POA: Diagnosis not present

## 2019-05-01 DIAGNOSIS — I69398 Other sequelae of cerebral infarction: Secondary | ICD-10-CM | POA: Diagnosis not present

## 2019-05-01 DIAGNOSIS — Z7984 Long term (current) use of oral hypoglycemic drugs: Secondary | ICD-10-CM | POA: Diagnosis not present

## 2019-05-01 DIAGNOSIS — D649 Anemia, unspecified: Secondary | ICD-10-CM | POA: Diagnosis not present

## 2019-05-01 DIAGNOSIS — E1140 Type 2 diabetes mellitus with diabetic neuropathy, unspecified: Secondary | ICD-10-CM | POA: Diagnosis not present

## 2019-05-01 DIAGNOSIS — E785 Hyperlipidemia, unspecified: Secondary | ICD-10-CM | POA: Diagnosis not present

## 2019-05-01 DIAGNOSIS — I509 Heart failure, unspecified: Secondary | ICD-10-CM | POA: Diagnosis not present

## 2019-05-01 DIAGNOSIS — R42 Dizziness and giddiness: Secondary | ICD-10-CM | POA: Diagnosis not present

## 2019-05-07 DIAGNOSIS — R51 Headache: Secondary | ICD-10-CM | POA: Diagnosis not present

## 2019-05-07 DIAGNOSIS — I2510 Atherosclerotic heart disease of native coronary artery without angina pectoris: Secondary | ICD-10-CM | POA: Diagnosis not present

## 2019-05-07 DIAGNOSIS — D649 Anemia, unspecified: Secondary | ICD-10-CM | POA: Diagnosis not present

## 2019-05-07 DIAGNOSIS — I252 Old myocardial infarction: Secondary | ICD-10-CM | POA: Diagnosis not present

## 2019-05-07 DIAGNOSIS — G4733 Obstructive sleep apnea (adult) (pediatric): Secondary | ICD-10-CM | POA: Diagnosis not present

## 2019-05-07 DIAGNOSIS — I69398 Other sequelae of cerebral infarction: Secondary | ICD-10-CM | POA: Diagnosis not present

## 2019-05-07 DIAGNOSIS — I509 Heart failure, unspecified: Secondary | ICD-10-CM | POA: Diagnosis not present

## 2019-05-07 DIAGNOSIS — E1140 Type 2 diabetes mellitus with diabetic neuropathy, unspecified: Secondary | ICD-10-CM | POA: Diagnosis not present

## 2019-05-07 DIAGNOSIS — J449 Chronic obstructive pulmonary disease, unspecified: Secondary | ICD-10-CM | POA: Diagnosis not present

## 2019-05-07 DIAGNOSIS — I110 Hypertensive heart disease with heart failure: Secondary | ICD-10-CM | POA: Diagnosis not present

## 2019-05-07 DIAGNOSIS — K219 Gastro-esophageal reflux disease without esophagitis: Secondary | ICD-10-CM | POA: Diagnosis not present

## 2019-05-07 DIAGNOSIS — Z7984 Long term (current) use of oral hypoglycemic drugs: Secondary | ICD-10-CM | POA: Diagnosis not present

## 2019-05-07 DIAGNOSIS — E785 Hyperlipidemia, unspecified: Secondary | ICD-10-CM | POA: Diagnosis not present

## 2019-05-07 DIAGNOSIS — R42 Dizziness and giddiness: Secondary | ICD-10-CM | POA: Diagnosis not present

## 2019-05-08 DIAGNOSIS — G4733 Obstructive sleep apnea (adult) (pediatric): Secondary | ICD-10-CM | POA: Diagnosis not present

## 2019-05-08 DIAGNOSIS — M170 Bilateral primary osteoarthritis of knee: Secondary | ICD-10-CM | POA: Diagnosis not present

## 2019-05-08 DIAGNOSIS — E0842 Diabetes mellitus due to underlying condition with diabetic polyneuropathy: Secondary | ICD-10-CM | POA: Diagnosis not present

## 2019-05-09 DIAGNOSIS — Z7984 Long term (current) use of oral hypoglycemic drugs: Secondary | ICD-10-CM | POA: Diagnosis not present

## 2019-05-09 DIAGNOSIS — I69398 Other sequelae of cerebral infarction: Secondary | ICD-10-CM | POA: Diagnosis not present

## 2019-05-09 DIAGNOSIS — E785 Hyperlipidemia, unspecified: Secondary | ICD-10-CM | POA: Diagnosis not present

## 2019-05-09 DIAGNOSIS — I509 Heart failure, unspecified: Secondary | ICD-10-CM | POA: Diagnosis not present

## 2019-05-09 DIAGNOSIS — R42 Dizziness and giddiness: Secondary | ICD-10-CM | POA: Diagnosis not present

## 2019-05-09 DIAGNOSIS — K219 Gastro-esophageal reflux disease without esophagitis: Secondary | ICD-10-CM | POA: Diagnosis not present

## 2019-05-09 DIAGNOSIS — G4733 Obstructive sleep apnea (adult) (pediatric): Secondary | ICD-10-CM | POA: Diagnosis not present

## 2019-05-09 DIAGNOSIS — I252 Old myocardial infarction: Secondary | ICD-10-CM | POA: Diagnosis not present

## 2019-05-09 DIAGNOSIS — J449 Chronic obstructive pulmonary disease, unspecified: Secondary | ICD-10-CM | POA: Diagnosis not present

## 2019-05-09 DIAGNOSIS — R51 Headache: Secondary | ICD-10-CM | POA: Diagnosis not present

## 2019-05-09 DIAGNOSIS — E1140 Type 2 diabetes mellitus with diabetic neuropathy, unspecified: Secondary | ICD-10-CM | POA: Diagnosis not present

## 2019-05-09 DIAGNOSIS — Z1231 Encounter for screening mammogram for malignant neoplasm of breast: Secondary | ICD-10-CM | POA: Diagnosis not present

## 2019-05-09 DIAGNOSIS — I2510 Atherosclerotic heart disease of native coronary artery without angina pectoris: Secondary | ICD-10-CM | POA: Diagnosis not present

## 2019-05-09 DIAGNOSIS — I110 Hypertensive heart disease with heart failure: Secondary | ICD-10-CM | POA: Diagnosis not present

## 2019-05-09 DIAGNOSIS — D649 Anemia, unspecified: Secondary | ICD-10-CM | POA: Diagnosis not present

## 2019-05-14 DIAGNOSIS — R51 Headache: Secondary | ICD-10-CM | POA: Diagnosis not present

## 2019-05-14 DIAGNOSIS — Z79899 Other long term (current) drug therapy: Secondary | ICD-10-CM | POA: Diagnosis not present

## 2019-05-14 DIAGNOSIS — G43909 Migraine, unspecified, not intractable, without status migrainosus: Secondary | ICD-10-CM | POA: Diagnosis not present

## 2019-05-14 DIAGNOSIS — G4733 Obstructive sleep apnea (adult) (pediatric): Secondary | ICD-10-CM | POA: Diagnosis not present

## 2019-05-14 DIAGNOSIS — G894 Chronic pain syndrome: Secondary | ICD-10-CM | POA: Diagnosis not present

## 2019-05-14 DIAGNOSIS — Z955 Presence of coronary angioplasty implant and graft: Secondary | ICD-10-CM | POA: Diagnosis not present

## 2019-05-14 DIAGNOSIS — I252 Old myocardial infarction: Secondary | ICD-10-CM | POA: Diagnosis not present

## 2019-05-14 DIAGNOSIS — D649 Anemia, unspecified: Secondary | ICD-10-CM | POA: Diagnosis not present

## 2019-05-14 DIAGNOSIS — Z7984 Long term (current) use of oral hypoglycemic drugs: Secondary | ICD-10-CM | POA: Diagnosis not present

## 2019-05-14 DIAGNOSIS — E785 Hyperlipidemia, unspecified: Secondary | ICD-10-CM | POA: Diagnosis not present

## 2019-05-14 DIAGNOSIS — K219 Gastro-esophageal reflux disease without esophagitis: Secondary | ICD-10-CM | POA: Diagnosis not present

## 2019-05-14 DIAGNOSIS — E1140 Type 2 diabetes mellitus with diabetic neuropathy, unspecified: Secondary | ICD-10-CM | POA: Diagnosis not present

## 2019-05-14 DIAGNOSIS — I2510 Atherosclerotic heart disease of native coronary artery without angina pectoris: Secondary | ICD-10-CM | POA: Diagnosis not present

## 2019-05-14 DIAGNOSIS — Z87891 Personal history of nicotine dependence: Secondary | ICD-10-CM | POA: Diagnosis not present

## 2019-05-14 DIAGNOSIS — I509 Heart failure, unspecified: Secondary | ICD-10-CM | POA: Diagnosis not present

## 2019-05-14 DIAGNOSIS — J449 Chronic obstructive pulmonary disease, unspecified: Secondary | ICD-10-CM | POA: Diagnosis not present

## 2019-05-14 DIAGNOSIS — I69398 Other sequelae of cerebral infarction: Secondary | ICD-10-CM | POA: Diagnosis not present

## 2019-05-14 DIAGNOSIS — I110 Hypertensive heart disease with heart failure: Secondary | ICD-10-CM | POA: Diagnosis not present

## 2019-05-14 DIAGNOSIS — R42 Dizziness and giddiness: Secondary | ICD-10-CM | POA: Diagnosis not present

## 2019-05-22 DIAGNOSIS — G4733 Obstructive sleep apnea (adult) (pediatric): Secondary | ICD-10-CM | POA: Diagnosis not present

## 2019-05-24 DIAGNOSIS — I110 Hypertensive heart disease with heart failure: Secondary | ICD-10-CM | POA: Diagnosis not present

## 2019-05-24 DIAGNOSIS — R51 Headache: Secondary | ICD-10-CM | POA: Diagnosis not present

## 2019-05-24 DIAGNOSIS — G894 Chronic pain syndrome: Secondary | ICD-10-CM | POA: Diagnosis not present

## 2019-05-24 DIAGNOSIS — R42 Dizziness and giddiness: Secondary | ICD-10-CM | POA: Diagnosis not present

## 2019-05-29 DIAGNOSIS — G43709 Chronic migraine without aura, not intractable, without status migrainosus: Secondary | ICD-10-CM | POA: Diagnosis not present

## 2019-05-30 DIAGNOSIS — Z8673 Personal history of transient ischemic attack (TIA), and cerebral infarction without residual deficits: Secondary | ICD-10-CM | POA: Diagnosis not present

## 2019-05-30 DIAGNOSIS — H53002 Unspecified amblyopia, left eye: Secondary | ICD-10-CM | POA: Diagnosis not present

## 2019-05-30 DIAGNOSIS — I639 Cerebral infarction, unspecified: Secondary | ICD-10-CM | POA: Diagnosis not present

## 2019-05-30 DIAGNOSIS — E1169 Type 2 diabetes mellitus with other specified complication: Secondary | ICD-10-CM | POA: Diagnosis not present

## 2019-05-30 DIAGNOSIS — H25813 Combined forms of age-related cataract, bilateral: Secondary | ICD-10-CM | POA: Diagnosis not present

## 2019-06-05 DIAGNOSIS — Z7982 Long term (current) use of aspirin: Secondary | ICD-10-CM | POA: Diagnosis not present

## 2019-06-05 DIAGNOSIS — Z87891 Personal history of nicotine dependence: Secondary | ICD-10-CM | POA: Diagnosis not present

## 2019-06-05 DIAGNOSIS — Z888 Allergy status to other drugs, medicaments and biological substances status: Secondary | ICD-10-CM | POA: Diagnosis not present

## 2019-06-05 DIAGNOSIS — I639 Cerebral infarction, unspecified: Secondary | ICD-10-CM | POA: Diagnosis not present

## 2019-06-08 DIAGNOSIS — E0842 Diabetes mellitus due to underlying condition with diabetic polyneuropathy: Secondary | ICD-10-CM | POA: Diagnosis not present

## 2019-06-08 DIAGNOSIS — G4733 Obstructive sleep apnea (adult) (pediatric): Secondary | ICD-10-CM | POA: Diagnosis not present

## 2019-06-08 DIAGNOSIS — M170 Bilateral primary osteoarthritis of knee: Secondary | ICD-10-CM | POA: Diagnosis not present

## 2019-06-11 DIAGNOSIS — I69398 Other sequelae of cerebral infarction: Secondary | ICD-10-CM | POA: Diagnosis not present

## 2019-06-11 DIAGNOSIS — E1140 Type 2 diabetes mellitus with diabetic neuropathy, unspecified: Secondary | ICD-10-CM | POA: Diagnosis not present

## 2019-06-11 DIAGNOSIS — I252 Old myocardial infarction: Secondary | ICD-10-CM | POA: Diagnosis not present

## 2019-06-11 DIAGNOSIS — D649 Anemia, unspecified: Secondary | ICD-10-CM | POA: Diagnosis not present

## 2019-06-11 DIAGNOSIS — G43909 Migraine, unspecified, not intractable, without status migrainosus: Secondary | ICD-10-CM | POA: Diagnosis not present

## 2019-06-11 DIAGNOSIS — K219 Gastro-esophageal reflux disease without esophagitis: Secondary | ICD-10-CM | POA: Diagnosis not present

## 2019-06-11 DIAGNOSIS — I509 Heart failure, unspecified: Secondary | ICD-10-CM | POA: Diagnosis not present

## 2019-06-11 DIAGNOSIS — I2510 Atherosclerotic heart disease of native coronary artery without angina pectoris: Secondary | ICD-10-CM | POA: Diagnosis not present

## 2019-06-11 DIAGNOSIS — G4733 Obstructive sleep apnea (adult) (pediatric): Secondary | ICD-10-CM | POA: Diagnosis not present

## 2019-06-11 DIAGNOSIS — J449 Chronic obstructive pulmonary disease, unspecified: Secondary | ICD-10-CM | POA: Diagnosis not present

## 2019-06-11 DIAGNOSIS — G894 Chronic pain syndrome: Secondary | ICD-10-CM | POA: Diagnosis not present

## 2019-06-11 DIAGNOSIS — Z79899 Other long term (current) drug therapy: Secondary | ICD-10-CM | POA: Diagnosis not present

## 2019-06-11 DIAGNOSIS — E785 Hyperlipidemia, unspecified: Secondary | ICD-10-CM | POA: Diagnosis not present

## 2019-06-11 DIAGNOSIS — Z7984 Long term (current) use of oral hypoglycemic drugs: Secondary | ICD-10-CM | POA: Diagnosis not present

## 2019-06-11 DIAGNOSIS — H25813 Combined forms of age-related cataract, bilateral: Secondary | ICD-10-CM | POA: Diagnosis not present

## 2019-06-11 DIAGNOSIS — I110 Hypertensive heart disease with heart failure: Secondary | ICD-10-CM | POA: Diagnosis not present

## 2019-06-11 DIAGNOSIS — Z87891 Personal history of nicotine dependence: Secondary | ICD-10-CM | POA: Diagnosis not present

## 2019-06-11 DIAGNOSIS — R51 Headache: Secondary | ICD-10-CM | POA: Diagnosis not present

## 2019-06-11 DIAGNOSIS — Z955 Presence of coronary angioplasty implant and graft: Secondary | ICD-10-CM | POA: Diagnosis not present

## 2019-06-11 DIAGNOSIS — R42 Dizziness and giddiness: Secondary | ICD-10-CM | POA: Diagnosis not present

## 2019-06-13 DIAGNOSIS — H25813 Combined forms of age-related cataract, bilateral: Secondary | ICD-10-CM | POA: Diagnosis not present

## 2019-06-13 DIAGNOSIS — Z01812 Encounter for preprocedural laboratory examination: Secondary | ICD-10-CM | POA: Diagnosis not present

## 2019-06-13 DIAGNOSIS — Z01818 Encounter for other preprocedural examination: Secondary | ICD-10-CM | POA: Diagnosis not present

## 2019-06-18 ENCOUNTER — Ambulatory Visit
Admission: RE | Admit: 2019-06-18 | Discharge: 2019-06-18 | Disposition: A | Discharge status: Home / Self Care | Payer: Medicare Other | Source: Ambulatory Visit | Attending: Family Medicine | Admitting: Family Medicine

## 2019-06-18 ENCOUNTER — Encounter: Payer: Self-pay | Admitting: Family Medicine

## 2019-06-18 ENCOUNTER — Other Ambulatory Visit: Payer: Self-pay

## 2019-06-18 ENCOUNTER — Ambulatory Visit: Payer: Medicare Other

## 2019-06-18 ENCOUNTER — Ambulatory Visit (INDEPENDENT_AMBULATORY_CARE_PROVIDER_SITE_OTHER): Payer: Medicare Other | Admitting: Family Medicine

## 2019-06-18 VITALS — BP 108/60 | HR 81 | Ht 62.0 in | Wt 184.2 lb

## 2019-06-18 DIAGNOSIS — T148XXA Other injury of unspecified body region, initial encounter: Secondary | ICD-10-CM | POA: Diagnosis not present

## 2019-06-18 DIAGNOSIS — S9002XA Contusion of left ankle, initial encounter: Secondary | ICD-10-CM | POA: Diagnosis not present

## 2019-06-18 DIAGNOSIS — Z23 Encounter for immunization: Secondary | ICD-10-CM

## 2019-06-18 DIAGNOSIS — M25572 Pain in left ankle and joints of left foot: Secondary | ICD-10-CM

## 2019-06-18 NOTE — Assessment & Plan Note (Addendum)
H/o ankle and shin trauma 2 weeks ago when her other leg hit her L ankle and shin in attempts to remove shoe, with resultant bruising, swelling, and pain. No appreciable joint laxity but with pain to medial malleolus cannot rule out fracture, will obtain XR. Given ASA use and extensive bruising will obtain CBC. Advised symptomatic care with RICE.

## 2019-06-18 NOTE — Progress Notes (Signed)
  Subjective:   Patient ID: Christine Hebert    DOB: 1956/05/28, 63 y.o. female   MRN: [id number]  Christine Hebert is a 63 y.o. female with a history of HTN, h/o MI s/p stent, COPD, GERD, OA s/p TKR, asthma, former tobacco use, HLD, schizophrenia, h/o stroke here for   Bruise on foot -Reports 2 weeks ago she was working to take off her cane as she is when her leg slipped and hit the opposite left leg with resultant pain and bruising and subsequent swelling.  She denies any other trauma.   - On ASA 325mg , no other blood thinners.  Was previously on Plavix for short time after her stroke but none currently. - no other bleeding or bruising anywhere else -Does have pain on the anterior portion of her ankle and along her shin.  Denies any previous pain there.  - no personal or FH of clotting disorders.  - some tingling in fourth and fifth toes on left leg but not new. - walking normally with cane.  - took goody powder, helped some. Tylenol doesn't help.  - has been putting ice and heat on it.   Review of Systems:  Per HPI.  Medications and smoking status reviewed.  Objective:   BP 108/60   Pulse 81   Ht 5\' 2"  (1.575 m)   Wt 184 lb 4 oz (83.6 kg)   BMI 33.70 kg/m  Vitals and nursing note reviewed.  General: Overweight female, in no acute distress with non-toxic appearance Skin: warm, dry, expansive bruise to medial side of L ankle MSK: ROM grossly intact, antalgic gait, ambulates with cane. No appreciable joint laxity of ankle but some pain with anterior drawer and resisted dorsiflexion. Neuro: Alert and oriented, speech normal. Sensation fully intact to bilateral feet.      Assessment & Plan:   Ankle pain, left H/o ankle and shin trauma 2 weeks ago when her other leg hit her L ankle and shin in attempts to remove shoe, with resultant bruising, swelling, and pain. No appreciable joint laxity but with pain to medial malleolus cannot rule out fracture, will obtain XR. Given ASA use and  extensive bruising will obtain CBC. Advised symptomatic care with RICE.  Orders Placed This Encounter  Procedures  . DG Ankle Complete Left    Standing Status:   Future    Standing Expiration Date:   08/17/2020    Order Specific Question:   Reason for Exam (SYMPTOM  OR DIAGNOSIS REQUIRED)    Answer:   ankle pain, bruising    Order Specific Question:   Preferred imaging location?    Answer:   [facility]    Order Specific Question:   Radiology Contrast Protocol - do NOT remove file path    Answer:   \\[email or web address].pdf  . CBC   No orders of the defined types were placed in this encounter.   Rory Percy, DO PGY-3, [facility] 06/18/2019 2:49 PM

## 2019-06-18 NOTE — Patient Instructions (Signed)
It was great to see you!  Our plans for today:  - We are getting an xray of your foot. Go to [facility] on [address] to get this done. You do not need an appointment. Go between 8am-4pm Monday-Friday. We will call you with these results.  - Continue to use ice and ibuprofen as needed for pain and swelling - If the area gets worse or if you reinjure it, come back to see [place]. - Make an appointment with Dr. Nori Riis to reestablish care.  We are checking some labs today, we will call you or send you a letter if they are abnormal.   Take care and seek immediate care sooner if you develop any concerns.   Dr. [facility]

## 2019-06-19 LAB — CBC
Hematocrit: 36.9 % (ref 34.0–46.6)
Hemoglobin: 12.2 g/dL (ref 11.1–15.9)
MCH: 29.7 pg (ref 26.6–33.0)
MCHC: 33.1 g/dL (ref 31.5–35.7)
MCV: 90 fL (ref 79–97)
Platelets: 296 10*3/uL (ref 150–450)
RBC: 4.11 x10E6/uL (ref 3.77–5.28)
RDW: 14 % (ref 11.7–15.4)
WBC: 7.8 10*3/uL (ref 3.4–10.8)

## 2019-06-20 DIAGNOSIS — I252 Old myocardial infarction: Secondary | ICD-10-CM | POA: Diagnosis not present

## 2019-06-20 DIAGNOSIS — I2510 Atherosclerotic heart disease of native coronary artery without angina pectoris: Secondary | ICD-10-CM | POA: Diagnosis not present

## 2019-06-20 DIAGNOSIS — H2512 Age-related nuclear cataract, left eye: Secondary | ICD-10-CM | POA: Diagnosis not present

## 2019-06-20 DIAGNOSIS — J449 Chronic obstructive pulmonary disease, unspecified: Secondary | ICD-10-CM | POA: Diagnosis not present

## 2019-06-20 DIAGNOSIS — Z955 Presence of coronary angioplasty implant and graft: Secondary | ICD-10-CM | POA: Diagnosis not present

## 2019-06-20 DIAGNOSIS — H25813 Combined forms of age-related cataract, bilateral: Secondary | ICD-10-CM | POA: Diagnosis not present

## 2019-06-20 DIAGNOSIS — H2181 Floppy iris syndrome: Secondary | ICD-10-CM | POA: Diagnosis not present

## 2019-06-21 DIAGNOSIS — H4901 Third [oculomotor] nerve palsy, right eye: Secondary | ICD-10-CM | POA: Diagnosis not present

## 2019-06-21 DIAGNOSIS — Z961 Presence of intraocular lens: Secondary | ICD-10-CM | POA: Diagnosis not present

## 2019-06-21 DIAGNOSIS — H21502 Unspecified adhesions of iris, left eye: Secondary | ICD-10-CM | POA: Diagnosis not present

## 2019-06-21 DIAGNOSIS — Z9842 Cataract extraction status, left eye: Secondary | ICD-10-CM | POA: Diagnosis not present

## 2019-06-21 DIAGNOSIS — H53002 Unspecified amblyopia, left eye: Secondary | ICD-10-CM | POA: Diagnosis not present

## 2019-07-02 DIAGNOSIS — Z9889 Other specified postprocedural states: Secondary | ICD-10-CM | POA: Diagnosis not present

## 2019-07-02 DIAGNOSIS — Z961 Presence of intraocular lens: Secondary | ICD-10-CM | POA: Diagnosis not present

## 2019-07-02 DIAGNOSIS — H02402 Unspecified ptosis of left eyelid: Secondary | ICD-10-CM | POA: Diagnosis not present

## 2019-07-02 DIAGNOSIS — H53002 Unspecified amblyopia, left eye: Secondary | ICD-10-CM | POA: Diagnosis not present

## 2019-07-02 DIAGNOSIS — H02206 Unspecified lagophthalmos left eye, unspecified eyelid: Secondary | ICD-10-CM | POA: Diagnosis not present

## 2019-07-02 DIAGNOSIS — H532 Diplopia: Secondary | ICD-10-CM | POA: Diagnosis not present

## 2019-07-02 DIAGNOSIS — Z48810 Encounter for surgical aftercare following surgery on the sense organs: Secondary | ICD-10-CM | POA: Diagnosis not present

## 2019-07-02 DIAGNOSIS — I6389 Other cerebral infarction: Secondary | ICD-10-CM | POA: Diagnosis not present

## 2019-07-08 DIAGNOSIS — G4733 Obstructive sleep apnea (adult) (pediatric): Secondary | ICD-10-CM | POA: Diagnosis not present

## 2019-07-08 DIAGNOSIS — E0842 Diabetes mellitus due to underlying condition with diabetic polyneuropathy: Secondary | ICD-10-CM | POA: Diagnosis not present

## 2019-07-08 DIAGNOSIS — M170 Bilateral primary osteoarthritis of knee: Secondary | ICD-10-CM | POA: Diagnosis not present

## 2019-07-16 DIAGNOSIS — H4900 Third [oculomotor] nerve palsy, unspecified eye: Secondary | ICD-10-CM | POA: Diagnosis not present

## 2019-07-16 DIAGNOSIS — Z9889 Other specified postprocedural states: Secondary | ICD-10-CM | POA: Diagnosis not present

## 2019-07-16 DIAGNOSIS — H269 Unspecified cataract: Secondary | ICD-10-CM | POA: Diagnosis not present

## 2019-07-16 DIAGNOSIS — H532 Diplopia: Secondary | ICD-10-CM | POA: Diagnosis not present

## 2019-07-16 DIAGNOSIS — Z961 Presence of intraocular lens: Secondary | ICD-10-CM | POA: Diagnosis not present

## 2019-07-16 DIAGNOSIS — H53002 Unspecified amblyopia, left eye: Secondary | ICD-10-CM | POA: Diagnosis not present

## 2019-07-16 DIAGNOSIS — I639 Cerebral infarction, unspecified: Secondary | ICD-10-CM | POA: Diagnosis not present

## 2019-07-16 DIAGNOSIS — H21502 Unspecified adhesions of iris, left eye: Secondary | ICD-10-CM | POA: Diagnosis not present

## 2019-07-16 DIAGNOSIS — Z48810 Encounter for surgical aftercare following surgery on the sense organs: Secondary | ICD-10-CM | POA: Diagnosis not present

## 2019-07-16 DIAGNOSIS — H02401 Unspecified ptosis of right eyelid: Secondary | ICD-10-CM | POA: Diagnosis not present

## 2019-07-16 DIAGNOSIS — H02206 Unspecified lagophthalmos left eye, unspecified eyelid: Secondary | ICD-10-CM | POA: Diagnosis not present

## 2019-07-23 DIAGNOSIS — I259 Chronic ischemic heart disease, unspecified: Secondary | ICD-10-CM | POA: Diagnosis not present

## 2019-07-23 DIAGNOSIS — I2510 Atherosclerotic heart disease of native coronary artery without angina pectoris: Secondary | ICD-10-CM | POA: Diagnosis not present

## 2019-07-23 DIAGNOSIS — Z955 Presence of coronary angioplasty implant and graft: Secondary | ICD-10-CM | POA: Diagnosis not present

## 2019-07-23 DIAGNOSIS — E785 Hyperlipidemia, unspecified: Secondary | ICD-10-CM | POA: Diagnosis not present

## 2019-07-23 DIAGNOSIS — I252 Old myocardial infarction: Secondary | ICD-10-CM | POA: Diagnosis not present

## 2019-07-23 DIAGNOSIS — Z7982 Long term (current) use of aspirin: Secondary | ICD-10-CM | POA: Diagnosis not present

## 2019-07-23 DIAGNOSIS — E119 Type 2 diabetes mellitus without complications: Secondary | ICD-10-CM | POA: Diagnosis not present

## 2019-07-23 DIAGNOSIS — Z8673 Personal history of transient ischemic attack (TIA), and cerebral infarction without residual deficits: Secondary | ICD-10-CM | POA: Diagnosis not present

## 2019-07-23 DIAGNOSIS — J45909 Unspecified asthma, uncomplicated: Secondary | ICD-10-CM | POA: Diagnosis not present

## 2019-07-23 DIAGNOSIS — Z79899 Other long term (current) drug therapy: Secondary | ICD-10-CM | POA: Diagnosis not present

## 2019-07-23 DIAGNOSIS — I10 Essential (primary) hypertension: Secondary | ICD-10-CM | POA: Diagnosis not present

## 2019-07-24 ENCOUNTER — Other Ambulatory Visit: Payer: Self-pay

## 2019-07-24 ENCOUNTER — Ambulatory Visit (INDEPENDENT_AMBULATORY_CARE_PROVIDER_SITE_OTHER): Payer: Medicare Other | Admitting: Family Medicine

## 2019-07-24 ENCOUNTER — Encounter: Payer: Self-pay | Admitting: Family Medicine

## 2019-07-24 DIAGNOSIS — K5909 Other constipation: Secondary | ICD-10-CM | POA: Diagnosis not present

## 2019-07-25 ENCOUNTER — Telehealth: Payer: Self-pay | Admitting: *Deleted

## 2019-07-25 NOTE — Telephone Encounter (Signed)
Pt calling to check the status of linzess she discussed with PCP yesterday. Christen Bame, CMA

## 2019-07-26 DIAGNOSIS — K5909 Other constipation: Secondary | ICD-10-CM | POA: Insufficient documentation

## 2019-07-26 MED ORDER — LINACLOTIDE 72 MCG PO CAPS: 72.0000 ug | ORAL_CAPSULE | Freq: Every day | ORAL | 3 refills | Status: DC

## 2019-07-26 NOTE — Assessment & Plan Note (Signed)
We will restart her Linzess.  If it does not improve her symptoms in the next 2 to 3 weeks, she will let me know.

## 2019-07-26 NOTE — Telephone Encounter (Signed)
Dear Dema Severin Team  I have sent it in. I am not sure her insurance is going to cover it without a PA--we will see. Sorry for my tardiness Dorcas Mcmurray

## 2019-07-26 NOTE — Telephone Encounter (Signed)
LVM to call office to inform her of below. [name], CMA  

## 2019-07-26 NOTE — Progress Notes (Signed)
    CHIEF COMPLAINT / HPI: #1.  Abdominal pain.  Was getting some Linzess from her GI doctor but the prescription ran out and due to Covid she is not been back to their office.  Having return of her constipation symptoms and abdominal distention.  Still having bowel movements 2-3 times a week but they are small in quantity and hard.  Also notes an increase in her reflux.  REVIEW OF SYSTEMS: No unusual weight change, no fever, no cough.  No change in bowel or bladder habits except as per HPI.  PERTINENT  PMH / PSH: I have reviewed the patient's medications, allergies, past medical and surgical history, smoking status and updated in the EMR as appropriate.   OBJECTIVE: Vital signs reviewed. GENERAL: Well-developed, well-nourished, no acute distress. CARDIOVASCULAR: Regular rate and rhythm no murmur gallop or rub LUNGS: Clear to auscultation bilaterally, no rales or wheeze. ABDOMEN: Soft positive bowel sounds NEURO: No gross focal neurological deficits. MSK: Movement of extremity x 4.     ASSESSMENT / PLAN:   Chronic constipation We will restart her Linzess.  If it does not improve her symptoms in the next 2 to 3 weeks, she will let me know.

## 2019-07-30 DIAGNOSIS — Z48810 Encounter for surgical aftercare following surgery on the sense organs: Secondary | ICD-10-CM | POA: Diagnosis not present

## 2019-07-30 DIAGNOSIS — Z961 Presence of intraocular lens: Secondary | ICD-10-CM | POA: Diagnosis not present

## 2019-07-30 DIAGNOSIS — H25811 Combined forms of age-related cataract, right eye: Secondary | ICD-10-CM | POA: Diagnosis not present

## 2019-07-30 DIAGNOSIS — H53002 Unspecified amblyopia, left eye: Secondary | ICD-10-CM | POA: Diagnosis not present

## 2019-07-30 DIAGNOSIS — Z8673 Personal history of transient ischemic attack (TIA), and cerebral infarction without residual deficits: Secondary | ICD-10-CM | POA: Diagnosis not present

## 2019-07-30 DIAGNOSIS — H21502 Unspecified adhesions of iris, left eye: Secondary | ICD-10-CM | POA: Diagnosis not present

## 2019-07-30 DIAGNOSIS — H04129 Dry eye syndrome of unspecified lacrimal gland: Secondary | ICD-10-CM | POA: Diagnosis not present

## 2019-08-06 DIAGNOSIS — R1031 Right lower quadrant pain: Secondary | ICD-10-CM | POA: Diagnosis not present

## 2019-08-06 DIAGNOSIS — E139 Other specified diabetes mellitus without complications: Secondary | ICD-10-CM | POA: Diagnosis not present

## 2019-08-06 DIAGNOSIS — R1032 Left lower quadrant pain: Secondary | ICD-10-CM | POA: Diagnosis not present

## 2019-08-06 DIAGNOSIS — R1013 Epigastric pain: Secondary | ICD-10-CM | POA: Diagnosis not present

## 2019-08-06 DIAGNOSIS — M940 Chondrocostal junction syndrome [Tietze]: Secondary | ICD-10-CM | POA: Diagnosis not present

## 2019-08-06 DIAGNOSIS — R350 Frequency of micturition: Secondary | ICD-10-CM | POA: Diagnosis not present

## 2019-08-07 DIAGNOSIS — Z87891 Personal history of nicotine dependence: Secondary | ICD-10-CM | POA: Diagnosis not present

## 2019-08-07 DIAGNOSIS — I2510 Atherosclerotic heart disease of native coronary artery without angina pectoris: Secondary | ICD-10-CM | POA: Diagnosis not present

## 2019-08-07 DIAGNOSIS — I110 Hypertensive heart disease with heart failure: Secondary | ICD-10-CM | POA: Diagnosis not present

## 2019-08-07 DIAGNOSIS — E785 Hyperlipidemia, unspecified: Secondary | ICD-10-CM | POA: Diagnosis not present

## 2019-08-07 DIAGNOSIS — I509 Heart failure, unspecified: Secondary | ICD-10-CM | POA: Diagnosis not present

## 2019-08-07 DIAGNOSIS — G894 Chronic pain syndrome: Secondary | ICD-10-CM | POA: Diagnosis not present

## 2019-08-07 DIAGNOSIS — K5730 Diverticulosis of large intestine without perforation or abscess without bleeding: Secondary | ICD-10-CM | POA: Diagnosis not present

## 2019-08-07 DIAGNOSIS — K219 Gastro-esophageal reflux disease without esophagitis: Secondary | ICD-10-CM | POA: Diagnosis not present

## 2019-08-07 DIAGNOSIS — R519 Headache, unspecified: Secondary | ICD-10-CM | POA: Diagnosis not present

## 2019-08-07 DIAGNOSIS — I252 Old myocardial infarction: Secondary | ICD-10-CM | POA: Diagnosis not present

## 2019-08-07 DIAGNOSIS — Z9181 History of falling: Secondary | ICD-10-CM | POA: Diagnosis not present

## 2019-08-07 DIAGNOSIS — Z79899 Other long term (current) drug therapy: Secondary | ICD-10-CM | POA: Diagnosis not present

## 2019-08-07 DIAGNOSIS — R42 Dizziness and giddiness: Secondary | ICD-10-CM | POA: Diagnosis not present

## 2019-08-07 DIAGNOSIS — G43909 Migraine, unspecified, not intractable, without status migrainosus: Secondary | ICD-10-CM | POA: Diagnosis not present

## 2019-08-07 DIAGNOSIS — G4733 Obstructive sleep apnea (adult) (pediatric): Secondary | ICD-10-CM | POA: Diagnosis not present

## 2019-08-07 DIAGNOSIS — Z7984 Long term (current) use of oral hypoglycemic drugs: Secondary | ICD-10-CM | POA: Diagnosis not present

## 2019-08-07 DIAGNOSIS — J449 Chronic obstructive pulmonary disease, unspecified: Secondary | ICD-10-CM | POA: Diagnosis not present

## 2019-08-07 DIAGNOSIS — I69398 Other sequelae of cerebral infarction: Secondary | ICD-10-CM | POA: Diagnosis not present

## 2019-08-07 DIAGNOSIS — D649 Anemia, unspecified: Secondary | ICD-10-CM | POA: Diagnosis not present

## 2019-08-07 DIAGNOSIS — E1140 Type 2 diabetes mellitus with diabetic neuropathy, unspecified: Secondary | ICD-10-CM | POA: Diagnosis not present

## 2019-08-07 DIAGNOSIS — Z955 Presence of coronary angioplasty implant and graft: Secondary | ICD-10-CM | POA: Diagnosis not present

## 2019-08-08 DIAGNOSIS — G4733 Obstructive sleep apnea (adult) (pediatric): Secondary | ICD-10-CM | POA: Diagnosis not present

## 2019-08-08 DIAGNOSIS — K5901 Slow transit constipation: Secondary | ICD-10-CM | POA: Diagnosis not present

## 2019-08-08 DIAGNOSIS — E0842 Diabetes mellitus due to underlying condition with diabetic polyneuropathy: Secondary | ICD-10-CM | POA: Diagnosis not present

## 2019-08-08 DIAGNOSIS — M170 Bilateral primary osteoarthritis of knee: Secondary | ICD-10-CM | POA: Diagnosis not present

## 2019-08-08 DIAGNOSIS — I252 Old myocardial infarction: Secondary | ICD-10-CM | POA: Diagnosis not present

## 2019-08-16 DIAGNOSIS — M25551 Pain in right hip: Secondary | ICD-10-CM | POA: Diagnosis not present

## 2019-08-16 DIAGNOSIS — R0781 Pleurodynia: Secondary | ICD-10-CM | POA: Diagnosis not present

## 2019-08-16 DIAGNOSIS — W19XXXA Unspecified fall, initial encounter: Secondary | ICD-10-CM | POA: Diagnosis not present

## 2019-08-16 DIAGNOSIS — M25532 Pain in left wrist: Secondary | ICD-10-CM | POA: Diagnosis not present

## 2019-08-20 DIAGNOSIS — Z79899 Other long term (current) drug therapy: Secondary | ICD-10-CM | POA: Diagnosis not present

## 2019-08-20 DIAGNOSIS — M4307 Spondylolysis, lumbosacral region: Secondary | ICD-10-CM | POA: Diagnosis not present

## 2019-08-20 DIAGNOSIS — M4316 Spondylolisthesis, lumbar region: Secondary | ICD-10-CM | POA: Diagnosis not present

## 2019-08-20 DIAGNOSIS — M8588 Other specified disorders of bone density and structure, other site: Secondary | ICD-10-CM | POA: Diagnosis not present

## 2019-08-20 DIAGNOSIS — G4733 Obstructive sleep apnea (adult) (pediatric): Secondary | ICD-10-CM | POA: Diagnosis not present

## 2019-08-20 DIAGNOSIS — W19XXXS Unspecified fall, sequela: Secondary | ICD-10-CM | POA: Diagnosis not present

## 2019-08-20 DIAGNOSIS — M5136 Other intervertebral disc degeneration, lumbar region: Secondary | ICD-10-CM | POA: Diagnosis not present

## 2019-08-20 DIAGNOSIS — M47816 Spondylosis without myelopathy or radiculopathy, lumbar region: Secondary | ICD-10-CM | POA: Diagnosis not present

## 2019-08-20 DIAGNOSIS — M7062 Trochanteric bursitis, left hip: Secondary | ICD-10-CM | POA: Diagnosis not present

## 2019-08-20 DIAGNOSIS — M533 Sacrococcygeal disorders, not elsewhere classified: Secondary | ICD-10-CM | POA: Diagnosis not present

## 2019-08-20 DIAGNOSIS — Z9181 History of falling: Secondary | ICD-10-CM | POA: Diagnosis not present

## 2019-08-20 DIAGNOSIS — J449 Chronic obstructive pulmonary disease, unspecified: Secondary | ICD-10-CM | POA: Diagnosis not present

## 2019-08-20 DIAGNOSIS — Z9889 Other specified postprocedural states: Secondary | ICD-10-CM | POA: Diagnosis not present

## 2019-08-20 DIAGNOSIS — Z7982 Long term (current) use of aspirin: Secondary | ICD-10-CM | POA: Diagnosis not present

## 2019-08-20 DIAGNOSIS — M7061 Trochanteric bursitis, right hip: Secondary | ICD-10-CM | POA: Diagnosis not present

## 2019-08-28 DIAGNOSIS — M7062 Trochanteric bursitis, left hip: Secondary | ICD-10-CM | POA: Diagnosis not present

## 2019-08-28 DIAGNOSIS — E119 Type 2 diabetes mellitus without complications: Secondary | ICD-10-CM | POA: Diagnosis not present

## 2019-08-28 DIAGNOSIS — M7061 Trochanteric bursitis, right hip: Secondary | ICD-10-CM | POA: Diagnosis not present

## 2019-09-02 DIAGNOSIS — M533 Sacrococcygeal disorders, not elsewhere classified: Secondary | ICD-10-CM | POA: Diagnosis not present

## 2019-09-02 DIAGNOSIS — M545 Low back pain: Secondary | ICD-10-CM | POA: Diagnosis not present

## 2019-09-02 DIAGNOSIS — R262 Difficulty in walking, not elsewhere classified: Secondary | ICD-10-CM | POA: Diagnosis not present

## 2019-09-02 DIAGNOSIS — M47816 Spondylosis without myelopathy or radiculopathy, lumbar region: Secondary | ICD-10-CM | POA: Diagnosis not present

## 2019-09-02 DIAGNOSIS — Z7409 Other reduced mobility: Secondary | ICD-10-CM | POA: Diagnosis not present

## 2019-09-02 DIAGNOSIS — M2569 Stiffness of other specified joint, not elsewhere classified: Secondary | ICD-10-CM | POA: Diagnosis not present

## 2019-09-05 DIAGNOSIS — G4733 Obstructive sleep apnea (adult) (pediatric): Secondary | ICD-10-CM | POA: Diagnosis not present

## 2019-09-05 DIAGNOSIS — I252 Old myocardial infarction: Secondary | ICD-10-CM | POA: Diagnosis not present

## 2019-09-05 DIAGNOSIS — R519 Headache, unspecified: Secondary | ICD-10-CM | POA: Diagnosis not present

## 2019-09-05 DIAGNOSIS — E785 Hyperlipidemia, unspecified: Secondary | ICD-10-CM | POA: Diagnosis not present

## 2019-09-05 DIAGNOSIS — J449 Chronic obstructive pulmonary disease, unspecified: Secondary | ICD-10-CM | POA: Diagnosis not present

## 2019-09-05 DIAGNOSIS — I2510 Atherosclerotic heart disease of native coronary artery without angina pectoris: Secondary | ICD-10-CM | POA: Diagnosis not present

## 2019-09-05 DIAGNOSIS — D649 Anemia, unspecified: Secondary | ICD-10-CM | POA: Diagnosis not present

## 2019-09-05 DIAGNOSIS — Z79899 Other long term (current) drug therapy: Secondary | ICD-10-CM | POA: Diagnosis not present

## 2019-09-05 DIAGNOSIS — Z9181 History of falling: Secondary | ICD-10-CM | POA: Diagnosis not present

## 2019-09-05 DIAGNOSIS — E1140 Type 2 diabetes mellitus with diabetic neuropathy, unspecified: Secondary | ICD-10-CM | POA: Diagnosis not present

## 2019-09-05 DIAGNOSIS — G43909 Migraine, unspecified, not intractable, without status migrainosus: Secondary | ICD-10-CM | POA: Diagnosis not present

## 2019-09-05 DIAGNOSIS — Z7984 Long term (current) use of oral hypoglycemic drugs: Secondary | ICD-10-CM | POA: Diagnosis not present

## 2019-09-05 DIAGNOSIS — I69398 Other sequelae of cerebral infarction: Secondary | ICD-10-CM | POA: Diagnosis not present

## 2019-09-05 DIAGNOSIS — I110 Hypertensive heart disease with heart failure: Secondary | ICD-10-CM | POA: Diagnosis not present

## 2019-09-05 DIAGNOSIS — I509 Heart failure, unspecified: Secondary | ICD-10-CM | POA: Diagnosis not present

## 2019-09-05 DIAGNOSIS — K219 Gastro-esophageal reflux disease without esophagitis: Secondary | ICD-10-CM | POA: Diagnosis not present

## 2019-09-05 DIAGNOSIS — G894 Chronic pain syndrome: Secondary | ICD-10-CM | POA: Diagnosis not present

## 2019-09-05 DIAGNOSIS — Z87891 Personal history of nicotine dependence: Secondary | ICD-10-CM | POA: Diagnosis not present

## 2019-09-05 DIAGNOSIS — R42 Dizziness and giddiness: Secondary | ICD-10-CM | POA: Diagnosis not present

## 2019-09-05 DIAGNOSIS — Z955 Presence of coronary angioplasty implant and graft: Secondary | ICD-10-CM | POA: Diagnosis not present

## 2019-09-07 DIAGNOSIS — G4733 Obstructive sleep apnea (adult) (pediatric): Secondary | ICD-10-CM | POA: Diagnosis not present

## 2019-09-07 DIAGNOSIS — E0842 Diabetes mellitus due to underlying condition with diabetic polyneuropathy: Secondary | ICD-10-CM | POA: Diagnosis not present

## 2019-09-07 DIAGNOSIS — M170 Bilateral primary osteoarthritis of knee: Secondary | ICD-10-CM | POA: Diagnosis not present

## 2019-09-16 DIAGNOSIS — E0590 Thyrotoxicosis, unspecified without thyrotoxic crisis or storm: Secondary | ICD-10-CM | POA: Diagnosis not present

## 2019-09-16 DIAGNOSIS — Z0000 Encounter for general adult medical examination without abnormal findings: Secondary | ICD-10-CM | POA: Diagnosis not present

## 2019-09-16 DIAGNOSIS — E118 Type 2 diabetes mellitus with unspecified complications: Secondary | ICD-10-CM | POA: Diagnosis not present

## 2019-09-16 DIAGNOSIS — G4733 Obstructive sleep apnea (adult) (pediatric): Secondary | ICD-10-CM | POA: Diagnosis not present

## 2019-09-16 DIAGNOSIS — D649 Anemia, unspecified: Secondary | ICD-10-CM | POA: Diagnosis not present

## 2019-09-19 DIAGNOSIS — M533 Sacrococcygeal disorders, not elsewhere classified: Secondary | ICD-10-CM | POA: Diagnosis not present

## 2019-09-19 DIAGNOSIS — R262 Difficulty in walking, not elsewhere classified: Secondary | ICD-10-CM | POA: Diagnosis not present

## 2019-09-19 DIAGNOSIS — M545 Low back pain: Secondary | ICD-10-CM | POA: Diagnosis not present

## 2019-09-19 DIAGNOSIS — M2569 Stiffness of other specified joint, not elsewhere classified: Secondary | ICD-10-CM | POA: Diagnosis not present

## 2019-09-19 DIAGNOSIS — Z7409 Other reduced mobility: Secondary | ICD-10-CM | POA: Diagnosis not present

## 2019-09-19 DIAGNOSIS — M47816 Spondylosis without myelopathy or radiculopathy, lumbar region: Secondary | ICD-10-CM | POA: Diagnosis not present

## 2019-09-24 DIAGNOSIS — M47816 Spondylosis without myelopathy or radiculopathy, lumbar region: Secondary | ICD-10-CM | POA: Diagnosis not present

## 2019-09-24 DIAGNOSIS — M545 Low back pain: Secondary | ICD-10-CM | POA: Diagnosis not present

## 2019-09-24 DIAGNOSIS — M2569 Stiffness of other specified joint, not elsewhere classified: Secondary | ICD-10-CM | POA: Diagnosis not present

## 2019-09-24 DIAGNOSIS — M533 Sacrococcygeal disorders, not elsewhere classified: Secondary | ICD-10-CM | POA: Diagnosis not present

## 2019-09-24 DIAGNOSIS — Z7409 Other reduced mobility: Secondary | ICD-10-CM | POA: Diagnosis not present

## 2019-09-24 DIAGNOSIS — R262 Difficulty in walking, not elsewhere classified: Secondary | ICD-10-CM | POA: Diagnosis not present

## 2019-10-01 DIAGNOSIS — Z9181 History of falling: Secondary | ICD-10-CM | POA: Diagnosis not present

## 2019-10-01 DIAGNOSIS — R519 Headache, unspecified: Secondary | ICD-10-CM | POA: Diagnosis not present

## 2019-10-01 DIAGNOSIS — I110 Hypertensive heart disease with heart failure: Secondary | ICD-10-CM | POA: Diagnosis not present

## 2019-10-01 DIAGNOSIS — G43909 Migraine, unspecified, not intractable, without status migrainosus: Secondary | ICD-10-CM | POA: Diagnosis not present

## 2019-10-01 DIAGNOSIS — I69398 Other sequelae of cerebral infarction: Secondary | ICD-10-CM | POA: Diagnosis not present

## 2019-10-01 DIAGNOSIS — R42 Dizziness and giddiness: Secondary | ICD-10-CM | POA: Diagnosis not present

## 2019-10-01 DIAGNOSIS — K219 Gastro-esophageal reflux disease without esophagitis: Secondary | ICD-10-CM | POA: Diagnosis not present

## 2019-10-01 DIAGNOSIS — M25561 Pain in right knee: Secondary | ICD-10-CM | POA: Diagnosis not present

## 2019-10-01 DIAGNOSIS — E1140 Type 2 diabetes mellitus with diabetic neuropathy, unspecified: Secondary | ICD-10-CM | POA: Diagnosis not present

## 2019-10-01 DIAGNOSIS — G4733 Obstructive sleep apnea (adult) (pediatric): Secondary | ICD-10-CM | POA: Diagnosis not present

## 2019-10-01 DIAGNOSIS — I2510 Atherosclerotic heart disease of native coronary artery without angina pectoris: Secondary | ICD-10-CM | POA: Diagnosis not present

## 2019-10-01 DIAGNOSIS — E785 Hyperlipidemia, unspecified: Secondary | ICD-10-CM | POA: Diagnosis not present

## 2019-10-01 DIAGNOSIS — I252 Old myocardial infarction: Secondary | ICD-10-CM | POA: Diagnosis not present

## 2019-10-01 DIAGNOSIS — D649 Anemia, unspecified: Secondary | ICD-10-CM | POA: Diagnosis not present

## 2019-10-01 DIAGNOSIS — Z7984 Long term (current) use of oral hypoglycemic drugs: Secondary | ICD-10-CM | POA: Diagnosis not present

## 2019-10-01 DIAGNOSIS — Z87891 Personal history of nicotine dependence: Secondary | ICD-10-CM | POA: Diagnosis not present

## 2019-10-01 DIAGNOSIS — G894 Chronic pain syndrome: Secondary | ICD-10-CM | POA: Diagnosis not present

## 2019-10-01 DIAGNOSIS — J449 Chronic obstructive pulmonary disease, unspecified: Secondary | ICD-10-CM | POA: Diagnosis not present

## 2019-10-01 DIAGNOSIS — I509 Heart failure, unspecified: Secondary | ICD-10-CM | POA: Diagnosis not present

## 2019-10-01 DIAGNOSIS — Z79899 Other long term (current) drug therapy: Secondary | ICD-10-CM | POA: Diagnosis not present

## 2019-10-07 DIAGNOSIS — I2510 Atherosclerotic heart disease of native coronary artery without angina pectoris: Secondary | ICD-10-CM | POA: Diagnosis not present

## 2019-10-07 DIAGNOSIS — Z01812 Encounter for preprocedural laboratory examination: Secondary | ICD-10-CM | POA: Diagnosis not present

## 2019-10-07 DIAGNOSIS — E0590 Thyrotoxicosis, unspecified without thyrotoxic crisis or storm: Secondary | ICD-10-CM | POA: Diagnosis not present

## 2019-10-07 DIAGNOSIS — Z20822 Contact with and (suspected) exposure to covid-19: Secondary | ICD-10-CM | POA: Diagnosis not present

## 2019-10-07 DIAGNOSIS — M47816 Spondylosis without myelopathy or radiculopathy, lumbar region: Secondary | ICD-10-CM | POA: Diagnosis not present

## 2019-10-07 DIAGNOSIS — Z87891 Personal history of nicotine dependence: Secondary | ICD-10-CM | POA: Diagnosis not present

## 2019-10-08 DIAGNOSIS — E0842 Diabetes mellitus due to underlying condition with diabetic polyneuropathy: Secondary | ICD-10-CM | POA: Diagnosis not present

## 2019-10-08 DIAGNOSIS — M7061 Trochanteric bursitis, right hip: Secondary | ICD-10-CM | POA: Diagnosis not present

## 2019-10-08 DIAGNOSIS — M7062 Trochanteric bursitis, left hip: Secondary | ICD-10-CM | POA: Diagnosis not present

## 2019-10-08 DIAGNOSIS — M47816 Spondylosis without myelopathy or radiculopathy, lumbar region: Secondary | ICD-10-CM | POA: Diagnosis not present

## 2019-10-08 DIAGNOSIS — M533 Sacrococcygeal disorders, not elsewhere classified: Secondary | ICD-10-CM | POA: Diagnosis not present

## 2019-10-08 DIAGNOSIS — M170 Bilateral primary osteoarthritis of knee: Secondary | ICD-10-CM | POA: Diagnosis not present

## 2019-10-08 DIAGNOSIS — G4733 Obstructive sleep apnea (adult) (pediatric): Secondary | ICD-10-CM | POA: Diagnosis not present

## 2019-10-11 DIAGNOSIS — R0602 Shortness of breath: Secondary | ICD-10-CM | POA: Diagnosis not present

## 2019-10-11 DIAGNOSIS — R05 Cough: Secondary | ICD-10-CM | POA: Diagnosis not present

## 2019-10-17 DIAGNOSIS — E118 Type 2 diabetes mellitus with unspecified complications: Secondary | ICD-10-CM | POA: Diagnosis not present

## 2019-10-17 DIAGNOSIS — Z720 Tobacco use: Secondary | ICD-10-CM | POA: Diagnosis not present

## 2019-10-17 DIAGNOSIS — R519 Headache, unspecified: Secondary | ICD-10-CM | POA: Diagnosis not present

## 2019-10-17 DIAGNOSIS — J42 Unspecified chronic bronchitis: Secondary | ICD-10-CM | POA: Diagnosis not present

## 2019-10-17 DIAGNOSIS — J441 Chronic obstructive pulmonary disease with (acute) exacerbation: Secondary | ICD-10-CM | POA: Diagnosis not present

## 2019-10-17 DIAGNOSIS — M545 Low back pain: Secondary | ICD-10-CM | POA: Diagnosis not present

## 2019-10-17 DIAGNOSIS — R42 Dizziness and giddiness: Secondary | ICD-10-CM | POA: Diagnosis not present

## 2019-10-17 DIAGNOSIS — G4733 Obstructive sleep apnea (adult) (pediatric): Secondary | ICD-10-CM | POA: Diagnosis not present

## 2019-10-17 DIAGNOSIS — J4520 Mild intermittent asthma, uncomplicated: Secondary | ICD-10-CM | POA: Diagnosis not present

## 2019-10-17 DIAGNOSIS — G894 Chronic pain syndrome: Secondary | ICD-10-CM | POA: Diagnosis not present

## 2019-10-17 DIAGNOSIS — I69398 Other sequelae of cerebral infarction: Secondary | ICD-10-CM | POA: Diagnosis not present

## 2019-10-30 DIAGNOSIS — Z8673 Personal history of transient ischemic attack (TIA), and cerebral infarction without residual deficits: Secondary | ICD-10-CM | POA: Diagnosis not present

## 2019-10-30 DIAGNOSIS — G43709 Chronic migraine without aura, not intractable, without status migrainosus: Secondary | ICD-10-CM | POA: Diagnosis not present

## 2019-11-01 DIAGNOSIS — I69398 Other sequelae of cerebral infarction: Secondary | ICD-10-CM | POA: Diagnosis not present

## 2019-11-01 DIAGNOSIS — Z7984 Long term (current) use of oral hypoglycemic drugs: Secondary | ICD-10-CM | POA: Diagnosis not present

## 2019-11-01 DIAGNOSIS — I110 Hypertensive heart disease with heart failure: Secondary | ICD-10-CM | POA: Diagnosis not present

## 2019-11-01 DIAGNOSIS — I252 Old myocardial infarction: Secondary | ICD-10-CM | POA: Diagnosis not present

## 2019-11-01 DIAGNOSIS — E1140 Type 2 diabetes mellitus with diabetic neuropathy, unspecified: Secondary | ICD-10-CM | POA: Diagnosis not present

## 2019-11-01 DIAGNOSIS — R42 Dizziness and giddiness: Secondary | ICD-10-CM | POA: Diagnosis not present

## 2019-11-01 DIAGNOSIS — Z79899 Other long term (current) drug therapy: Secondary | ICD-10-CM | POA: Diagnosis not present

## 2019-11-01 DIAGNOSIS — K219 Gastro-esophageal reflux disease without esophagitis: Secondary | ICD-10-CM | POA: Diagnosis not present

## 2019-11-01 DIAGNOSIS — I2510 Atherosclerotic heart disease of native coronary artery without angina pectoris: Secondary | ICD-10-CM | POA: Diagnosis not present

## 2019-11-01 DIAGNOSIS — M25561 Pain in right knee: Secondary | ICD-10-CM | POA: Diagnosis not present

## 2019-11-01 DIAGNOSIS — G894 Chronic pain syndrome: Secondary | ICD-10-CM | POA: Diagnosis not present

## 2019-11-01 DIAGNOSIS — D649 Anemia, unspecified: Secondary | ICD-10-CM | POA: Diagnosis not present

## 2019-11-01 DIAGNOSIS — Z9181 History of falling: Secondary | ICD-10-CM | POA: Diagnosis not present

## 2019-11-01 DIAGNOSIS — I509 Heart failure, unspecified: Secondary | ICD-10-CM | POA: Diagnosis not present

## 2019-11-01 DIAGNOSIS — G43909 Migraine, unspecified, not intractable, without status migrainosus: Secondary | ICD-10-CM | POA: Diagnosis not present

## 2019-11-01 DIAGNOSIS — E785 Hyperlipidemia, unspecified: Secondary | ICD-10-CM | POA: Diagnosis not present

## 2019-11-01 DIAGNOSIS — Z87891 Personal history of nicotine dependence: Secondary | ICD-10-CM | POA: Diagnosis not present

## 2019-11-01 DIAGNOSIS — R519 Headache, unspecified: Secondary | ICD-10-CM | POA: Diagnosis not present

## 2019-11-01 DIAGNOSIS — G4733 Obstructive sleep apnea (adult) (pediatric): Secondary | ICD-10-CM | POA: Diagnosis not present

## 2019-11-01 DIAGNOSIS — J449 Chronic obstructive pulmonary disease, unspecified: Secondary | ICD-10-CM | POA: Diagnosis not present

## 2019-11-02 DIAGNOSIS — R519 Headache, unspecified: Secondary | ICD-10-CM | POA: Diagnosis not present

## 2019-11-02 DIAGNOSIS — R739 Hyperglycemia, unspecified: Secondary | ICD-10-CM | POA: Diagnosis not present

## 2019-11-08 DIAGNOSIS — G4733 Obstructive sleep apnea (adult) (pediatric): Secondary | ICD-10-CM | POA: Diagnosis not present

## 2019-11-08 DIAGNOSIS — E0842 Diabetes mellitus due to underlying condition with diabetic polyneuropathy: Secondary | ICD-10-CM | POA: Diagnosis not present

## 2019-11-08 DIAGNOSIS — M170 Bilateral primary osteoarthritis of knee: Secondary | ICD-10-CM | POA: Diagnosis not present

## 2019-11-11 DIAGNOSIS — Z9989 Dependence on other enabling machines and devices: Secondary | ICD-10-CM | POA: Diagnosis not present

## 2019-11-11 DIAGNOSIS — G4733 Obstructive sleep apnea (adult) (pediatric): Secondary | ICD-10-CM | POA: Diagnosis not present

## 2019-11-12 DIAGNOSIS — R0689 Other abnormalities of breathing: Secondary | ICD-10-CM | POA: Diagnosis not present

## 2019-11-15 DIAGNOSIS — R946 Abnormal results of thyroid function studies: Secondary | ICD-10-CM | POA: Diagnosis not present

## 2019-11-15 DIAGNOSIS — Z9989 Dependence on other enabling machines and devices: Secondary | ICD-10-CM | POA: Diagnosis not present

## 2019-11-25 DIAGNOSIS — G4733 Obstructive sleep apnea (adult) (pediatric): Secondary | ICD-10-CM | POA: Diagnosis not present

## 2019-11-29 DIAGNOSIS — Z7984 Long term (current) use of oral hypoglycemic drugs: Secondary | ICD-10-CM | POA: Diagnosis not present

## 2019-11-29 DIAGNOSIS — K219 Gastro-esophageal reflux disease without esophagitis: Secondary | ICD-10-CM | POA: Diagnosis not present

## 2019-11-29 DIAGNOSIS — Z79899 Other long term (current) drug therapy: Secondary | ICD-10-CM | POA: Diagnosis not present

## 2019-11-29 DIAGNOSIS — R42 Dizziness and giddiness: Secondary | ICD-10-CM | POA: Diagnosis not present

## 2019-11-29 DIAGNOSIS — G4733 Obstructive sleep apnea (adult) (pediatric): Secondary | ICD-10-CM | POA: Diagnosis not present

## 2019-11-29 DIAGNOSIS — J449 Chronic obstructive pulmonary disease, unspecified: Secondary | ICD-10-CM | POA: Diagnosis not present

## 2019-11-29 DIAGNOSIS — D649 Anemia, unspecified: Secondary | ICD-10-CM | POA: Diagnosis not present

## 2019-11-29 DIAGNOSIS — I509 Heart failure, unspecified: Secondary | ICD-10-CM | POA: Diagnosis not present

## 2019-11-29 DIAGNOSIS — R519 Headache, unspecified: Secondary | ICD-10-CM | POA: Diagnosis not present

## 2019-11-29 DIAGNOSIS — I69398 Other sequelae of cerebral infarction: Secondary | ICD-10-CM | POA: Diagnosis not present

## 2019-11-29 DIAGNOSIS — Z9181 History of falling: Secondary | ICD-10-CM | POA: Diagnosis not present

## 2019-11-29 DIAGNOSIS — I252 Old myocardial infarction: Secondary | ICD-10-CM | POA: Diagnosis not present

## 2019-11-29 DIAGNOSIS — E1140 Type 2 diabetes mellitus with diabetic neuropathy, unspecified: Secondary | ICD-10-CM | POA: Diagnosis not present

## 2019-11-29 DIAGNOSIS — I110 Hypertensive heart disease with heart failure: Secondary | ICD-10-CM | POA: Diagnosis not present

## 2019-11-29 DIAGNOSIS — G43909 Migraine, unspecified, not intractable, without status migrainosus: Secondary | ICD-10-CM | POA: Diagnosis not present

## 2019-11-29 DIAGNOSIS — Z87891 Personal history of nicotine dependence: Secondary | ICD-10-CM | POA: Diagnosis not present

## 2019-11-29 DIAGNOSIS — E785 Hyperlipidemia, unspecified: Secondary | ICD-10-CM | POA: Diagnosis not present

## 2019-11-29 DIAGNOSIS — I2510 Atherosclerotic heart disease of native coronary artery without angina pectoris: Secondary | ICD-10-CM | POA: Diagnosis not present

## 2019-11-29 DIAGNOSIS — G894 Chronic pain syndrome: Secondary | ICD-10-CM | POA: Diagnosis not present

## 2019-12-06 DIAGNOSIS — M170 Bilateral primary osteoarthritis of knee: Secondary | ICD-10-CM | POA: Diagnosis not present

## 2019-12-06 DIAGNOSIS — G4733 Obstructive sleep apnea (adult) (pediatric): Secondary | ICD-10-CM | POA: Diagnosis not present

## 2019-12-06 DIAGNOSIS — E0842 Diabetes mellitus due to underlying condition with diabetic polyneuropathy: Secondary | ICD-10-CM | POA: Diagnosis not present

## 2019-12-09 DIAGNOSIS — G4733 Obstructive sleep apnea (adult) (pediatric): Secondary | ICD-10-CM | POA: Diagnosis not present

## 2019-12-09 DIAGNOSIS — J449 Chronic obstructive pulmonary disease, unspecified: Secondary | ICD-10-CM | POA: Diagnosis not present

## 2019-12-09 DIAGNOSIS — E119 Type 2 diabetes mellitus without complications: Secondary | ICD-10-CM | POA: Diagnosis not present

## 2019-12-09 DIAGNOSIS — E785 Hyperlipidemia, unspecified: Secondary | ICD-10-CM | POA: Diagnosis not present

## 2019-12-09 DIAGNOSIS — G43909 Migraine, unspecified, not intractable, without status migrainosus: Secondary | ICD-10-CM | POA: Diagnosis not present

## 2019-12-09 DIAGNOSIS — Z79899 Other long term (current) drug therapy: Secondary | ICD-10-CM | POA: Diagnosis not present

## 2019-12-09 DIAGNOSIS — Z7982 Long term (current) use of aspirin: Secondary | ICD-10-CM | POA: Diagnosis not present

## 2019-12-09 DIAGNOSIS — Z7984 Long term (current) use of oral hypoglycemic drugs: Secondary | ICD-10-CM | POA: Diagnosis not present

## 2019-12-09 DIAGNOSIS — Z8673 Personal history of transient ischemic attack (TIA), and cerebral infarction without residual deficits: Secondary | ICD-10-CM | POA: Diagnosis not present

## 2019-12-09 DIAGNOSIS — I10 Essential (primary) hypertension: Secondary | ICD-10-CM | POA: Diagnosis not present

## 2019-12-10 ENCOUNTER — Other Ambulatory Visit: Payer: Self-pay | Admitting: Family Medicine

## 2019-12-11 DIAGNOSIS — Z79899 Other long term (current) drug therapy: Secondary | ICD-10-CM | POA: Diagnosis not present

## 2019-12-11 DIAGNOSIS — Z720 Tobacco use: Secondary | ICD-10-CM | POA: Diagnosis not present

## 2019-12-11 DIAGNOSIS — J441 Chronic obstructive pulmonary disease with (acute) exacerbation: Secondary | ICD-10-CM | POA: Diagnosis not present

## 2019-12-11 DIAGNOSIS — I259 Chronic ischemic heart disease, unspecified: Secondary | ICD-10-CM | POA: Diagnosis not present

## 2019-12-11 DIAGNOSIS — K5900 Constipation, unspecified: Secondary | ICD-10-CM | POA: Diagnosis not present

## 2019-12-11 DIAGNOSIS — R042 Hemoptysis: Secondary | ICD-10-CM | POA: Diagnosis not present

## 2019-12-11 DIAGNOSIS — E118 Type 2 diabetes mellitus with unspecified complications: Secondary | ICD-10-CM | POA: Diagnosis not present

## 2020-01-06 DIAGNOSIS — G4733 Obstructive sleep apnea (adult) (pediatric): Secondary | ICD-10-CM | POA: Diagnosis not present

## 2020-01-06 DIAGNOSIS — M170 Bilateral primary osteoarthritis of knee: Secondary | ICD-10-CM | POA: Diagnosis not present

## 2020-01-06 DIAGNOSIS — E0842 Diabetes mellitus due to underlying condition with diabetic polyneuropathy: Secondary | ICD-10-CM | POA: Diagnosis not present

## 2020-01-21 DIAGNOSIS — R12 Heartburn: Secondary | ICD-10-CM | POA: Diagnosis not present

## 2020-01-21 DIAGNOSIS — E118 Type 2 diabetes mellitus with unspecified complications: Secondary | ICD-10-CM | POA: Diagnosis not present

## 2020-01-21 DIAGNOSIS — R300 Dysuria: Secondary | ICD-10-CM | POA: Diagnosis not present

## 2020-01-21 DIAGNOSIS — R1012 Left upper quadrant pain: Secondary | ICD-10-CM | POA: Diagnosis not present

## 2020-02-04 DIAGNOSIS — Z79899 Other long term (current) drug therapy: Secondary | ICD-10-CM | POA: Diagnosis not present

## 2020-02-04 DIAGNOSIS — Z7982 Long term (current) use of aspirin: Secondary | ICD-10-CM | POA: Diagnosis not present

## 2020-02-04 DIAGNOSIS — Z8673 Personal history of transient ischemic attack (TIA), and cerebral infarction without residual deficits: Secondary | ICD-10-CM | POA: Diagnosis not present

## 2020-02-04 DIAGNOSIS — G43709 Chronic migraine without aura, not intractable, without status migrainosus: Secondary | ICD-10-CM | POA: Diagnosis not present

## 2020-02-05 DIAGNOSIS — E0842 Diabetes mellitus due to underlying condition with diabetic polyneuropathy: Secondary | ICD-10-CM | POA: Diagnosis not present

## 2020-02-05 DIAGNOSIS — G4733 Obstructive sleep apnea (adult) (pediatric): Secondary | ICD-10-CM | POA: Diagnosis not present

## 2020-02-05 DIAGNOSIS — M170 Bilateral primary osteoarthritis of knee: Secondary | ICD-10-CM | POA: Diagnosis not present

## 2020-02-06 DIAGNOSIS — K118 Other diseases of salivary glands: Secondary | ICD-10-CM | POA: Diagnosis not present

## 2020-02-10 DIAGNOSIS — R5382 Chronic fatigue, unspecified: Secondary | ICD-10-CM | POA: Diagnosis not present

## 2020-02-10 DIAGNOSIS — R12 Heartburn: Secondary | ICD-10-CM | POA: Diagnosis not present

## 2020-02-10 DIAGNOSIS — E118 Type 2 diabetes mellitus with unspecified complications: Secondary | ICD-10-CM | POA: Diagnosis not present

## 2020-02-10 DIAGNOSIS — R1012 Left upper quadrant pain: Secondary | ICD-10-CM | POA: Diagnosis not present

## 2020-02-10 DIAGNOSIS — R300 Dysuria: Secondary | ICD-10-CM | POA: Diagnosis not present

## 2020-02-19 DIAGNOSIS — K219 Gastro-esophageal reflux disease without esophagitis: Secondary | ICD-10-CM | POA: Diagnosis not present

## 2020-02-19 DIAGNOSIS — E118 Type 2 diabetes mellitus with unspecified complications: Secondary | ICD-10-CM | POA: Diagnosis not present

## 2020-02-19 DIAGNOSIS — R197 Diarrhea, unspecified: Secondary | ICD-10-CM | POA: Diagnosis not present

## 2020-02-19 DIAGNOSIS — R1012 Left upper quadrant pain: Secondary | ICD-10-CM | POA: Diagnosis not present

## 2020-02-19 DIAGNOSIS — R12 Heartburn: Secondary | ICD-10-CM | POA: Diagnosis not present

## 2020-02-26 DIAGNOSIS — G4733 Obstructive sleep apnea (adult) (pediatric): Secondary | ICD-10-CM | POA: Diagnosis not present

## 2020-03-02 DIAGNOSIS — G43719 Chronic migraine without aura, intractable, without status migrainosus: Secondary | ICD-10-CM | POA: Diagnosis not present

## 2020-03-07 DIAGNOSIS — E0842 Diabetes mellitus due to underlying condition with diabetic polyneuropathy: Secondary | ICD-10-CM | POA: Diagnosis not present

## 2020-03-07 DIAGNOSIS — G4733 Obstructive sleep apnea (adult) (pediatric): Secondary | ICD-10-CM | POA: Diagnosis not present

## 2020-03-07 DIAGNOSIS — M170 Bilateral primary osteoarthritis of knee: Secondary | ICD-10-CM | POA: Diagnosis not present

## 2020-03-10 DIAGNOSIS — R110 Nausea: Secondary | ICD-10-CM | POA: Diagnosis not present

## 2020-03-10 DIAGNOSIS — K5900 Constipation, unspecified: Secondary | ICD-10-CM | POA: Diagnosis not present

## 2020-03-10 DIAGNOSIS — R194 Change in bowel habit: Secondary | ICD-10-CM | POA: Diagnosis not present

## 2020-03-10 DIAGNOSIS — K219 Gastro-esophageal reflux disease without esophagitis: Secondary | ICD-10-CM | POA: Diagnosis not present

## 2020-03-20 DIAGNOSIS — K5900 Constipation, unspecified: Secondary | ICD-10-CM | POA: Diagnosis not present

## 2020-04-06 DIAGNOSIS — Z720 Tobacco use: Secondary | ICD-10-CM | POA: Diagnosis not present

## 2020-04-06 DIAGNOSIS — R5382 Chronic fatigue, unspecified: Secondary | ICD-10-CM | POA: Diagnosis not present

## 2020-04-06 DIAGNOSIS — M25561 Pain in right knee: Secondary | ICD-10-CM | POA: Diagnosis not present

## 2020-04-06 DIAGNOSIS — E0842 Diabetes mellitus due to underlying condition with diabetic polyneuropathy: Secondary | ICD-10-CM | POA: Diagnosis not present

## 2020-04-06 DIAGNOSIS — G4733 Obstructive sleep apnea (adult) (pediatric): Secondary | ICD-10-CM | POA: Diagnosis not present

## 2020-04-06 DIAGNOSIS — R12 Heartburn: Secondary | ICD-10-CM | POA: Diagnosis not present

## 2020-04-06 DIAGNOSIS — M170 Bilateral primary osteoarthritis of knee: Secondary | ICD-10-CM | POA: Diagnosis not present

## 2020-04-06 DIAGNOSIS — E118 Type 2 diabetes mellitus with unspecified complications: Secondary | ICD-10-CM | POA: Diagnosis not present

## 2020-04-16 DIAGNOSIS — H52203 Unspecified astigmatism, bilateral: Secondary | ICD-10-CM | POA: Diagnosis not present

## 2020-04-16 DIAGNOSIS — E119 Type 2 diabetes mellitus without complications: Secondary | ICD-10-CM | POA: Diagnosis not present

## 2020-04-16 DIAGNOSIS — H2513 Age-related nuclear cataract, bilateral: Secondary | ICD-10-CM | POA: Diagnosis not present

## 2020-04-16 DIAGNOSIS — Z961 Presence of intraocular lens: Secondary | ICD-10-CM | POA: Diagnosis not present

## 2020-04-16 DIAGNOSIS — H524 Presbyopia: Secondary | ICD-10-CM | POA: Diagnosis not present

## 2020-04-21 DIAGNOSIS — M1711 Unilateral primary osteoarthritis, right knee: Secondary | ICD-10-CM | POA: Diagnosis not present

## 2020-04-21 DIAGNOSIS — M25561 Pain in right knee: Secondary | ICD-10-CM | POA: Diagnosis not present

## 2020-04-21 DIAGNOSIS — G8929 Other chronic pain: Secondary | ICD-10-CM | POA: Diagnosis not present

## 2020-04-22 DIAGNOSIS — Z881 Allergy status to other antibiotic agents status: Secondary | ICD-10-CM | POA: Diagnosis not present

## 2020-04-22 DIAGNOSIS — Z886 Allergy status to analgesic agent status: Secondary | ICD-10-CM | POA: Diagnosis not present

## 2020-04-22 DIAGNOSIS — M4726 Other spondylosis with radiculopathy, lumbar region: Secondary | ICD-10-CM | POA: Diagnosis not present

## 2020-04-22 DIAGNOSIS — M47816 Spondylosis without myelopathy or radiculopathy, lumbar region: Secondary | ICD-10-CM | POA: Diagnosis not present

## 2020-04-22 DIAGNOSIS — Z91040 Latex allergy status: Secondary | ICD-10-CM | POA: Diagnosis not present

## 2020-04-22 DIAGNOSIS — M5416 Radiculopathy, lumbar region: Secondary | ICD-10-CM | POA: Diagnosis not present

## 2020-04-22 DIAGNOSIS — M48061 Spinal stenosis, lumbar region without neurogenic claudication: Secondary | ICD-10-CM | POA: Diagnosis not present

## 2020-04-22 DIAGNOSIS — Z888 Allergy status to other drugs, medicaments and biological substances status: Secondary | ICD-10-CM | POA: Diagnosis not present

## 2020-04-22 DIAGNOSIS — Z882 Allergy status to sulfonamides status: Secondary | ICD-10-CM | POA: Diagnosis not present

## 2020-04-22 DIAGNOSIS — Z880 Allergy status to penicillin: Secondary | ICD-10-CM | POA: Diagnosis not present

## 2020-04-27 DIAGNOSIS — E118 Type 2 diabetes mellitus with unspecified complications: Secondary | ICD-10-CM | POA: Diagnosis not present

## 2020-04-27 DIAGNOSIS — Z713 Dietary counseling and surveillance: Secondary | ICD-10-CM | POA: Diagnosis not present

## 2020-05-06 DIAGNOSIS — M48061 Spinal stenosis, lumbar region without neurogenic claudication: Secondary | ICD-10-CM | POA: Diagnosis not present

## 2020-05-06 DIAGNOSIS — Z79899 Other long term (current) drug therapy: Secondary | ICD-10-CM | POA: Diagnosis not present

## 2020-05-06 DIAGNOSIS — M5416 Radiculopathy, lumbar region: Secondary | ICD-10-CM | POA: Diagnosis not present

## 2020-05-11 DIAGNOSIS — G8929 Other chronic pain: Secondary | ICD-10-CM | POA: Diagnosis not present

## 2020-05-11 DIAGNOSIS — M25561 Pain in right knee: Secondary | ICD-10-CM | POA: Diagnosis not present

## 2020-05-11 DIAGNOSIS — M545 Low back pain: Secondary | ICD-10-CM | POA: Diagnosis not present

## 2020-05-11 DIAGNOSIS — M25551 Pain in right hip: Secondary | ICD-10-CM | POA: Diagnosis not present

## 2020-05-14 IMAGING — DX DG ANKLE COMPLETE 3+V*L*
3 series · 3 of 3 positions shown · non-contrast
Comparison: None.

CLINICAL DATA: Left ankle pain and bruising.

EXAM:
LEFT ANKLE COMPLETE - 3+ VIEW

[dg ankle complete left (1 of 3)]
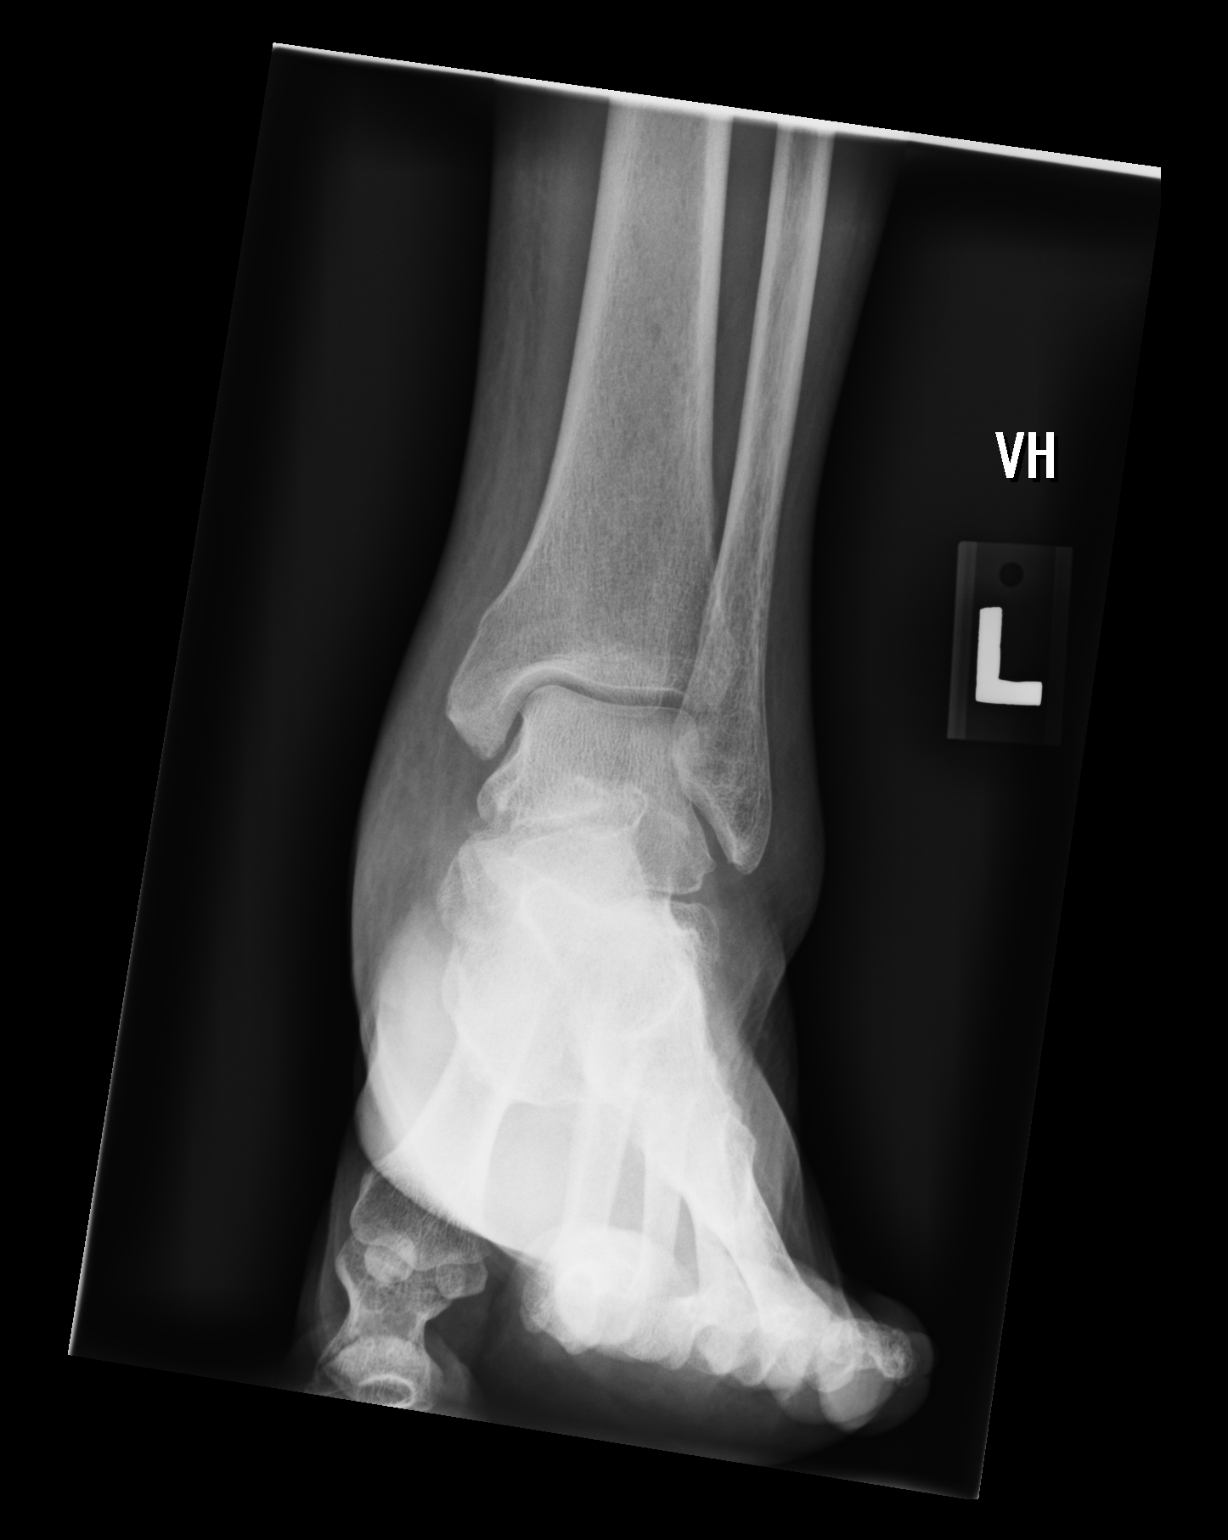

[dg ankle complete left (2 of 3)]
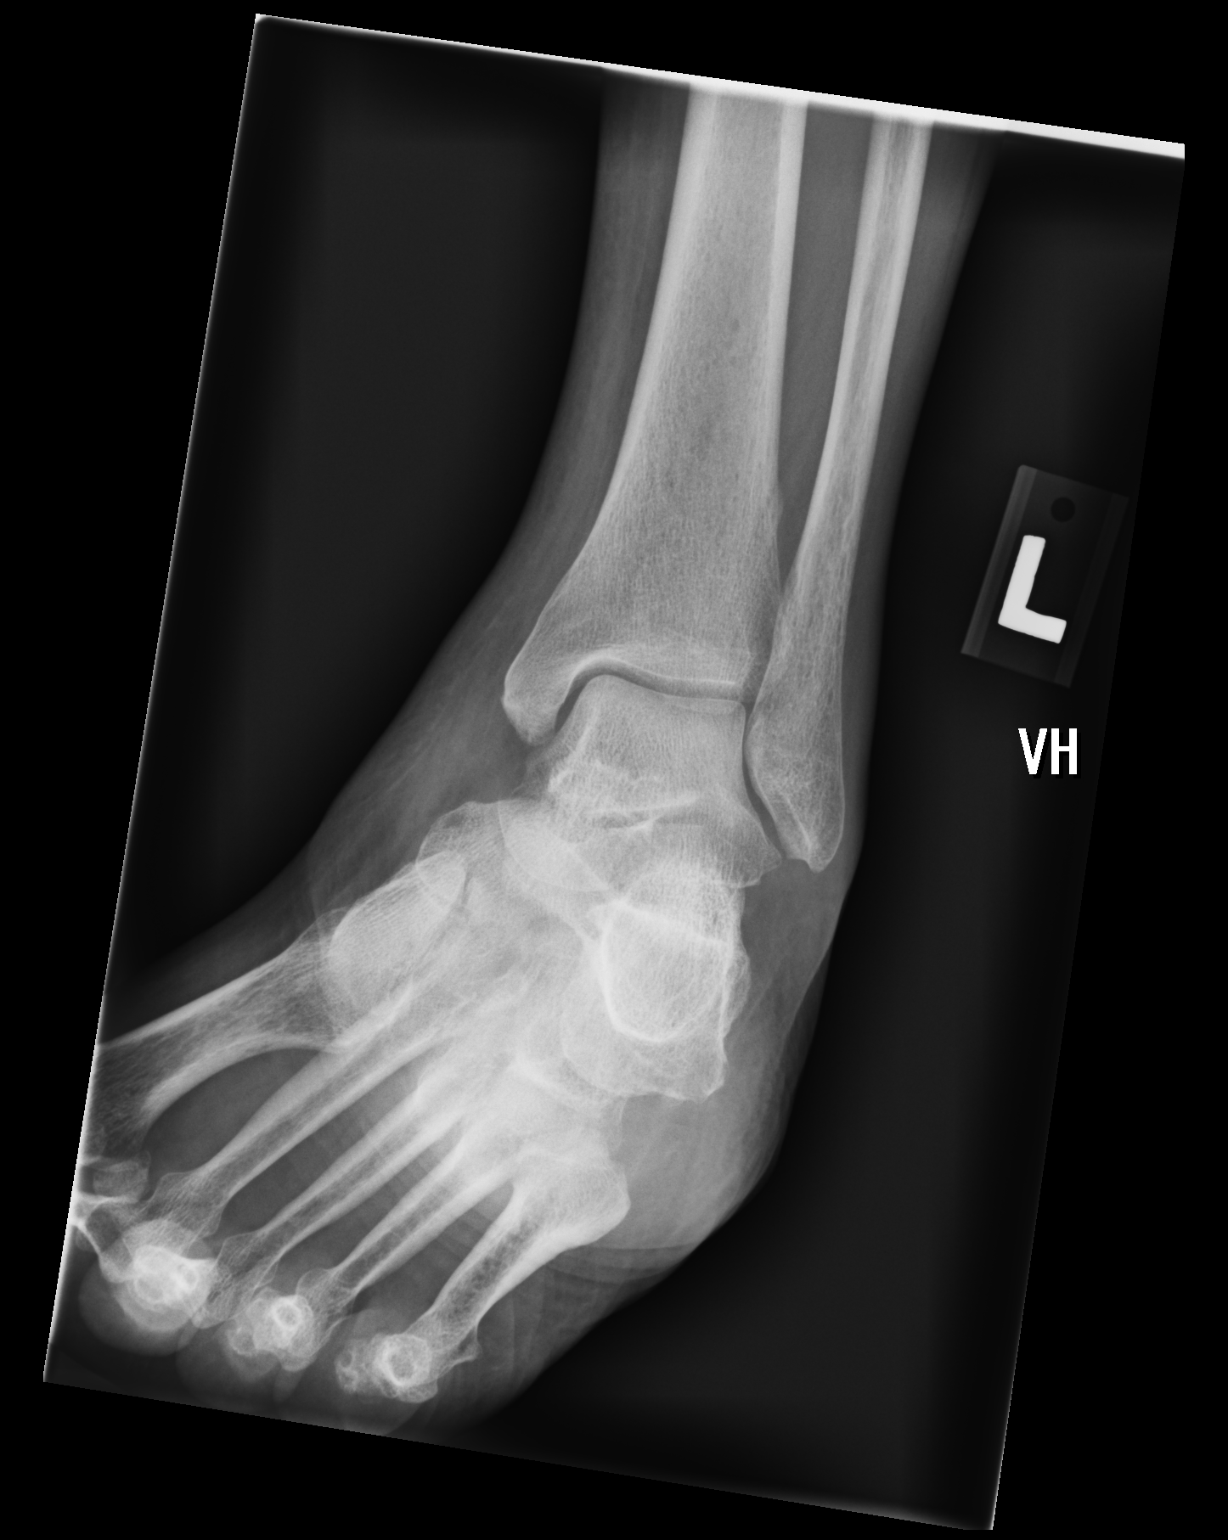

[dg ankle complete left (3 of 3)]
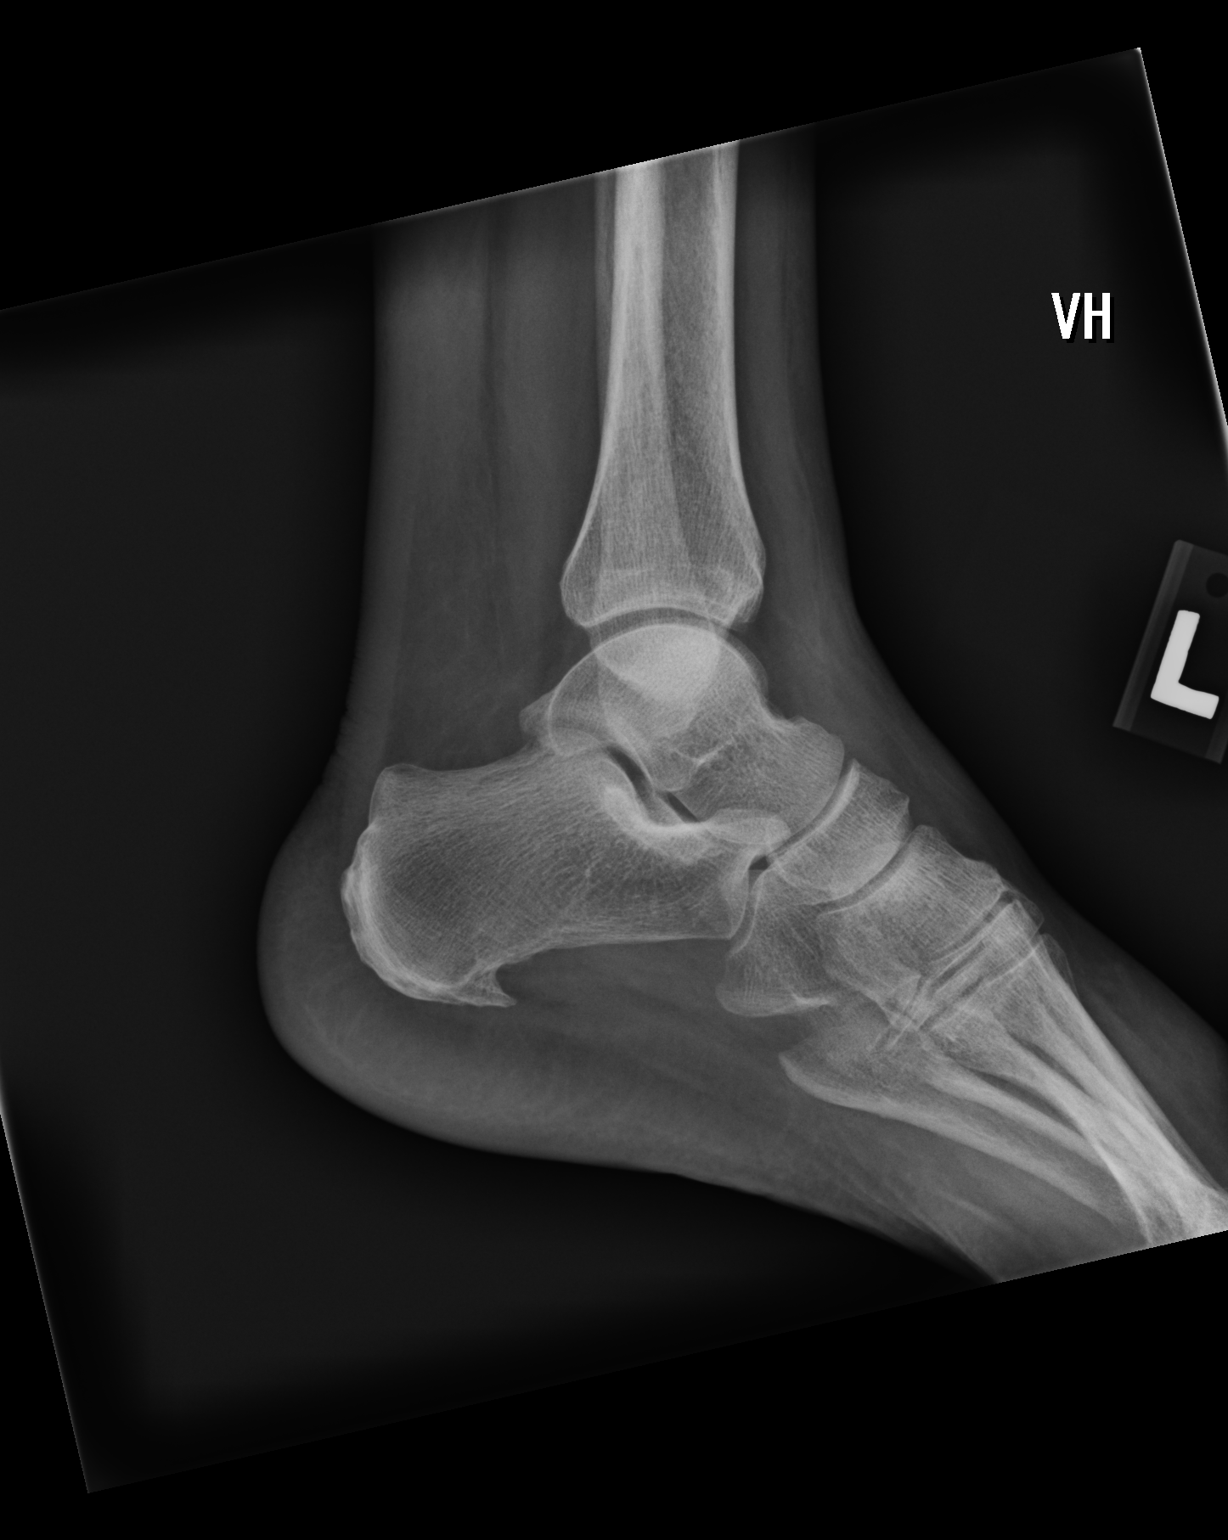

[3 of 3 positions shown; findings below may reference images not displayed]

FINDINGS: There is no evidence of fracture, dislocation, or joint effusion.
There is a small plantar calcaneal spur. There is no evidence of
arthropathy or other focal bone abnormality. There is generalized
soft tissue swelling around the left ankle.
IMPRESSION: No acute osseous injury of the left ankle.

## 2020-05-21 DIAGNOSIS — M47816 Spondylosis without myelopathy or radiculopathy, lumbar region: Secondary | ICD-10-CM | POA: Diagnosis not present

## 2020-05-25 DIAGNOSIS — G43719 Chronic migraine without aura, intractable, without status migrainosus: Secondary | ICD-10-CM | POA: Diagnosis not present

## 2020-05-26 DIAGNOSIS — G43719 Chronic migraine without aura, intractable, without status migrainosus: Secondary | ICD-10-CM | POA: Diagnosis not present

## 2020-05-26 DIAGNOSIS — G4733 Obstructive sleep apnea (adult) (pediatric): Secondary | ICD-10-CM | POA: Diagnosis not present

## 2020-07-28 DIAGNOSIS — J441 Chronic obstructive pulmonary disease with (acute) exacerbation: Secondary | ICD-10-CM | POA: Diagnosis not present

## 2020-07-28 DIAGNOSIS — Z20822 Contact with and (suspected) exposure to covid-19: Secondary | ICD-10-CM | POA: Diagnosis not present

## 2020-08-10 DIAGNOSIS — R7309 Other abnormal glucose: Secondary | ICD-10-CM | POA: Diagnosis not present

## 2020-08-10 DIAGNOSIS — Z713 Dietary counseling and surveillance: Secondary | ICD-10-CM | POA: Diagnosis not present

## 2020-08-13 DIAGNOSIS — H9192 Unspecified hearing loss, left ear: Secondary | ICD-10-CM | POA: Diagnosis not present

## 2020-08-13 DIAGNOSIS — F17200 Nicotine dependence, unspecified, uncomplicated: Secondary | ICD-10-CM | POA: Diagnosis not present

## 2020-08-13 DIAGNOSIS — Z972 Presence of dental prosthetic device (complete) (partial): Secondary | ICD-10-CM | POA: Diagnosis not present

## 2020-08-13 DIAGNOSIS — H9202 Otalgia, left ear: Secondary | ICD-10-CM | POA: Diagnosis not present

## 2020-08-13 DIAGNOSIS — M26609 Unspecified temporomandibular joint disorder, unspecified side: Secondary | ICD-10-CM | POA: Diagnosis not present

## 2020-08-13 DIAGNOSIS — H9313 Tinnitus, bilateral: Secondary | ICD-10-CM | POA: Diagnosis not present

## 2020-08-13 DIAGNOSIS — K0889 Other specified disorders of teeth and supporting structures: Secondary | ICD-10-CM | POA: Diagnosis not present

## 2020-08-13 DIAGNOSIS — M26622 Arthralgia of left temporomandibular joint: Secondary | ICD-10-CM | POA: Diagnosis not present

## 2020-08-13 DIAGNOSIS — K219 Gastro-esophageal reflux disease without esophagitis: Secondary | ICD-10-CM | POA: Diagnosis not present

## 2020-08-13 DIAGNOSIS — R490 Dysphonia: Secondary | ICD-10-CM | POA: Diagnosis not present

## 2020-08-13 DIAGNOSIS — E118 Type 2 diabetes mellitus with unspecified complications: Secondary | ICD-10-CM | POA: Diagnosis not present

## 2020-08-13 DIAGNOSIS — K062 Gingival and edentulous alveolar ridge lesions associated with trauma: Secondary | ICD-10-CM | POA: Diagnosis not present

## 2020-08-13 DIAGNOSIS — K068 Other specified disorders of gingiva and edentulous alveolar ridge: Secondary | ICD-10-CM | POA: Diagnosis not present

## 2020-08-13 DIAGNOSIS — K1370 Unspecified lesions of oral mucosa: Secondary | ICD-10-CM | POA: Diagnosis not present

## 2020-08-19 DIAGNOSIS — E118 Type 2 diabetes mellitus with unspecified complications: Secondary | ICD-10-CM | POA: Diagnosis not present

## 2020-08-19 DIAGNOSIS — R5382 Chronic fatigue, unspecified: Secondary | ICD-10-CM | POA: Diagnosis not present

## 2020-08-19 DIAGNOSIS — G4733 Obstructive sleep apnea (adult) (pediatric): Secondary | ICD-10-CM | POA: Diagnosis not present

## 2020-08-19 DIAGNOSIS — Z720 Tobacco use: Secondary | ICD-10-CM | POA: Diagnosis not present

## 2020-08-19 DIAGNOSIS — G8929 Other chronic pain: Secondary | ICD-10-CM | POA: Diagnosis not present

## 2020-08-25 DIAGNOSIS — Z79899 Other long term (current) drug therapy: Secondary | ICD-10-CM | POA: Diagnosis not present

## 2020-08-25 DIAGNOSIS — Z7984 Long term (current) use of oral hypoglycemic drugs: Secondary | ICD-10-CM | POA: Diagnosis not present

## 2020-08-25 DIAGNOSIS — R600 Localized edema: Secondary | ICD-10-CM | POA: Diagnosis not present

## 2020-08-25 DIAGNOSIS — E119 Type 2 diabetes mellitus without complications: Secondary | ICD-10-CM | POA: Diagnosis not present

## 2020-08-25 DIAGNOSIS — I259 Chronic ischemic heart disease, unspecified: Secondary | ICD-10-CM | POA: Diagnosis not present

## 2020-08-25 DIAGNOSIS — I10 Essential (primary) hypertension: Secondary | ICD-10-CM | POA: Diagnosis not present

## 2020-08-25 DIAGNOSIS — Z8673 Personal history of transient ischemic attack (TIA), and cerebral infarction without residual deficits: Secondary | ICD-10-CM | POA: Diagnosis not present

## 2020-08-25 DIAGNOSIS — I252 Old myocardial infarction: Secondary | ICD-10-CM | POA: Diagnosis not present

## 2020-08-25 DIAGNOSIS — F17210 Nicotine dependence, cigarettes, uncomplicated: Secondary | ICD-10-CM | POA: Diagnosis not present

## 2020-08-25 DIAGNOSIS — E785 Hyperlipidemia, unspecified: Secondary | ICD-10-CM | POA: Diagnosis not present

## 2020-08-25 DIAGNOSIS — Z7982 Long term (current) use of aspirin: Secondary | ICD-10-CM | POA: Diagnosis not present

## 2020-08-25 DIAGNOSIS — J45909 Unspecified asthma, uncomplicated: Secondary | ICD-10-CM | POA: Diagnosis not present

## 2020-08-25 DIAGNOSIS — Z955 Presence of coronary angioplasty implant and graft: Secondary | ICD-10-CM | POA: Diagnosis not present

## 2020-08-27 DIAGNOSIS — G4733 Obstructive sleep apnea (adult) (pediatric): Secondary | ICD-10-CM | POA: Diagnosis not present

## 2020-08-31 DIAGNOSIS — R490 Dysphonia: Secondary | ICD-10-CM | POA: Diagnosis not present

## 2020-08-31 DIAGNOSIS — K068 Other specified disorders of gingiva and edentulous alveolar ridge: Secondary | ICD-10-CM | POA: Diagnosis not present

## 2020-08-31 DIAGNOSIS — Z972 Presence of dental prosthetic device (complete) (partial): Secondary | ICD-10-CM | POA: Diagnosis not present

## 2020-08-31 DIAGNOSIS — K5730 Diverticulosis of large intestine without perforation or abscess without bleeding: Secondary | ICD-10-CM | POA: Diagnosis not present

## 2020-08-31 DIAGNOSIS — H903 Sensorineural hearing loss, bilateral: Secondary | ICD-10-CM | POA: Diagnosis not present

## 2020-08-31 DIAGNOSIS — H9313 Tinnitus, bilateral: Secondary | ICD-10-CM | POA: Diagnosis not present

## 2020-08-31 DIAGNOSIS — F17210 Nicotine dependence, cigarettes, uncomplicated: Secondary | ICD-10-CM | POA: Diagnosis not present

## 2020-08-31 DIAGNOSIS — Z7984 Long term (current) use of oral hypoglycemic drugs: Secondary | ICD-10-CM | POA: Diagnosis not present

## 2020-08-31 DIAGNOSIS — M26603 Bilateral temporomandibular joint disorder, unspecified: Secondary | ICD-10-CM | POA: Diagnosis not present

## 2020-08-31 DIAGNOSIS — H9202 Otalgia, left ear: Secondary | ICD-10-CM | POA: Diagnosis not present

## 2020-08-31 DIAGNOSIS — R221 Localized swelling, mass and lump, neck: Secondary | ICD-10-CM | POA: Diagnosis not present

## 2020-08-31 DIAGNOSIS — G43719 Chronic migraine without aura, intractable, without status migrainosus: Secondary | ICD-10-CM | POA: Diagnosis not present

## 2020-08-31 DIAGNOSIS — E119 Type 2 diabetes mellitus without complications: Secondary | ICD-10-CM | POA: Diagnosis not present

## 2020-09-03 DIAGNOSIS — M47816 Spondylosis without myelopathy or radiculopathy, lumbar region: Secondary | ICD-10-CM | POA: Diagnosis not present

## 2020-09-03 DIAGNOSIS — Z79899 Other long term (current) drug therapy: Secondary | ICD-10-CM | POA: Diagnosis not present

## 2020-09-04 DIAGNOSIS — Z720 Tobacco use: Secondary | ICD-10-CM | POA: Diagnosis not present

## 2020-09-04 DIAGNOSIS — I252 Old myocardial infarction: Secondary | ICD-10-CM | POA: Diagnosis not present

## 2020-09-04 DIAGNOSIS — R0602 Shortness of breath: Secondary | ICD-10-CM | POA: Diagnosis not present

## 2020-09-04 DIAGNOSIS — J42 Unspecified chronic bronchitis: Secondary | ICD-10-CM | POA: Diagnosis not present

## 2020-09-04 DIAGNOSIS — I272 Pulmonary hypertension, unspecified: Secondary | ICD-10-CM | POA: Diagnosis not present

## 2020-09-04 DIAGNOSIS — E118 Type 2 diabetes mellitus with unspecified complications: Secondary | ICD-10-CM | POA: Diagnosis not present

## 2020-09-07 DIAGNOSIS — Z882 Allergy status to sulfonamides status: Secondary | ICD-10-CM | POA: Diagnosis not present

## 2020-09-07 DIAGNOSIS — Z885 Allergy status to narcotic agent status: Secondary | ICD-10-CM | POA: Diagnosis not present

## 2020-09-07 DIAGNOSIS — M25561 Pain in right knee: Secondary | ICD-10-CM | POA: Diagnosis not present

## 2020-09-07 DIAGNOSIS — Z888 Allergy status to other drugs, medicaments and biological substances status: Secondary | ICD-10-CM | POA: Diagnosis not present

## 2020-09-07 DIAGNOSIS — G8929 Other chronic pain: Secondary | ICD-10-CM | POA: Diagnosis not present

## 2020-09-07 DIAGNOSIS — Z881 Allergy status to other antibiotic agents status: Secondary | ICD-10-CM | POA: Diagnosis not present

## 2020-09-07 DIAGNOSIS — Z880 Allergy status to penicillin: Secondary | ICD-10-CM | POA: Diagnosis not present

## 2020-09-07 DIAGNOSIS — M1711 Unilateral primary osteoarthritis, right knee: Secondary | ICD-10-CM | POA: Diagnosis not present

## 2020-09-07 DIAGNOSIS — Z886 Allergy status to analgesic agent status: Secondary | ICD-10-CM | POA: Diagnosis not present

## 2020-09-14 DIAGNOSIS — M5416 Radiculopathy, lumbar region: Secondary | ICD-10-CM | POA: Diagnosis not present

## 2020-09-14 DIAGNOSIS — M5417 Radiculopathy, lumbosacral region: Secondary | ICD-10-CM | POA: Diagnosis not present

## 2020-09-14 DIAGNOSIS — G629 Polyneuropathy, unspecified: Secondary | ICD-10-CM | POA: Diagnosis not present

## 2020-09-21 DIAGNOSIS — Z9189 Other specified personal risk factors, not elsewhere classified: Secondary | ICD-10-CM | POA: Diagnosis not present

## 2020-09-24 DIAGNOSIS — E119 Type 2 diabetes mellitus without complications: Secondary | ICD-10-CM | POA: Diagnosis not present

## 2020-09-24 DIAGNOSIS — M47816 Spondylosis without myelopathy or radiculopathy, lumbar region: Secondary | ICD-10-CM | POA: Diagnosis not present

## 2020-10-01 DIAGNOSIS — G4440 Drug-induced headache, not elsewhere classified, not intractable: Secondary | ICD-10-CM | POA: Diagnosis not present

## 2020-10-01 DIAGNOSIS — G43909 Migraine, unspecified, not intractable, without status migrainosus: Secondary | ICD-10-CM | POA: Diagnosis not present

## 2020-10-01 DIAGNOSIS — M545 Low back pain, unspecified: Secondary | ICD-10-CM | POA: Diagnosis not present

## 2020-10-01 DIAGNOSIS — I6789 Other cerebrovascular disease: Secondary | ICD-10-CM | POA: Diagnosis not present

## 2020-10-01 DIAGNOSIS — G8929 Other chronic pain: Secondary | ICD-10-CM | POA: Diagnosis not present

## 2020-10-01 DIAGNOSIS — Z7982 Long term (current) use of aspirin: Secondary | ICD-10-CM | POA: Diagnosis not present

## 2020-10-01 DIAGNOSIS — Z8673 Personal history of transient ischemic attack (TIA), and cerebral infarction without residual deficits: Secondary | ICD-10-CM | POA: Diagnosis not present

## 2020-10-01 DIAGNOSIS — E0842 Diabetes mellitus due to underlying condition with diabetic polyneuropathy: Secondary | ICD-10-CM | POA: Diagnosis not present

## 2020-10-22 DIAGNOSIS — M5126 Other intervertebral disc displacement, lumbar region: Secondary | ICD-10-CM | POA: Diagnosis not present

## 2020-10-22 DIAGNOSIS — M545 Low back pain, unspecified: Secondary | ICD-10-CM | POA: Diagnosis not present

## 2020-10-22 DIAGNOSIS — G8929 Other chronic pain: Secondary | ICD-10-CM | POA: Diagnosis not present

## 2020-10-23 DIAGNOSIS — Z20822 Contact with and (suspected) exposure to covid-19: Secondary | ICD-10-CM | POA: Diagnosis not present

## 2020-10-23 DIAGNOSIS — J441 Chronic obstructive pulmonary disease with (acute) exacerbation: Secondary | ICD-10-CM | POA: Diagnosis not present

## 2020-10-23 DIAGNOSIS — G4733 Obstructive sleep apnea (adult) (pediatric): Secondary | ICD-10-CM | POA: Diagnosis not present

## 2020-10-26 DIAGNOSIS — R911 Solitary pulmonary nodule: Secondary | ICD-10-CM | POA: Diagnosis not present

## 2020-10-26 DIAGNOSIS — J441 Chronic obstructive pulmonary disease with (acute) exacerbation: Secondary | ICD-10-CM | POA: Diagnosis not present

## 2020-10-26 DIAGNOSIS — E118 Type 2 diabetes mellitus with unspecified complications: Secondary | ICD-10-CM | POA: Diagnosis not present

## 2020-10-26 DIAGNOSIS — Z720 Tobacco use: Secondary | ICD-10-CM | POA: Diagnosis not present

## 2020-10-26 DIAGNOSIS — R61 Generalized hyperhidrosis: Secondary | ICD-10-CM | POA: Diagnosis not present

## 2020-10-30 DIAGNOSIS — J441 Chronic obstructive pulmonary disease with (acute) exacerbation: Secondary | ICD-10-CM | POA: Diagnosis not present

## 2020-10-30 DIAGNOSIS — R61 Generalized hyperhidrosis: Secondary | ICD-10-CM | POA: Diagnosis not present

## 2020-10-30 DIAGNOSIS — Z720 Tobacco use: Secondary | ICD-10-CM | POA: Diagnosis not present

## 2020-10-30 DIAGNOSIS — R911 Solitary pulmonary nodule: Secondary | ICD-10-CM | POA: Diagnosis not present

## 2020-10-30 DIAGNOSIS — E118 Type 2 diabetes mellitus with unspecified complications: Secondary | ICD-10-CM | POA: Diagnosis not present

## 2020-11-02 DIAGNOSIS — R0602 Shortness of breath: Secondary | ICD-10-CM | POA: Diagnosis not present

## 2020-11-02 DIAGNOSIS — Z20822 Contact with and (suspected) exposure to covid-19: Secondary | ICD-10-CM | POA: Diagnosis not present

## 2020-11-02 DIAGNOSIS — B349 Viral infection, unspecified: Secondary | ICD-10-CM | POA: Diagnosis not present

## 2020-11-02 DIAGNOSIS — R058 Other specified cough: Secondary | ICD-10-CM | POA: Diagnosis not present

## 2020-11-04 DIAGNOSIS — M48061 Spinal stenosis, lumbar region without neurogenic claudication: Secondary | ICD-10-CM | POA: Diagnosis not present

## 2020-11-04 DIAGNOSIS — M5146 Schmorl's nodes, lumbar region: Secondary | ICD-10-CM | POA: Diagnosis not present

## 2020-11-05 DIAGNOSIS — R0602 Shortness of breath: Secondary | ICD-10-CM | POA: Diagnosis not present

## 2020-11-05 DIAGNOSIS — M48061 Spinal stenosis, lumbar region without neurogenic claudication: Secondary | ICD-10-CM | POA: Diagnosis not present

## 2020-11-09 DIAGNOSIS — J441 Chronic obstructive pulmonary disease with (acute) exacerbation: Secondary | ICD-10-CM | POA: Diagnosis not present

## 2020-11-09 DIAGNOSIS — R911 Solitary pulmonary nodule: Secondary | ICD-10-CM | POA: Diagnosis not present

## 2020-11-09 DIAGNOSIS — F17210 Nicotine dependence, cigarettes, uncomplicated: Secondary | ICD-10-CM | POA: Diagnosis not present

## 2020-11-09 DIAGNOSIS — Z122 Encounter for screening for malignant neoplasm of respiratory organs: Secondary | ICD-10-CM | POA: Diagnosis not present

## 2020-11-16 DIAGNOSIS — M545 Low back pain, unspecified: Secondary | ICD-10-CM | POA: Diagnosis not present

## 2020-11-16 DIAGNOSIS — Z5181 Encounter for therapeutic drug level monitoring: Secondary | ICD-10-CM | POA: Diagnosis not present

## 2020-11-16 DIAGNOSIS — M48061 Spinal stenosis, lumbar region without neurogenic claudication: Secondary | ICD-10-CM | POA: Diagnosis not present

## 2020-11-23 DIAGNOSIS — M5416 Radiculopathy, lumbar region: Secondary | ICD-10-CM | POA: Diagnosis not present

## 2020-11-23 DIAGNOSIS — M48062 Spinal stenosis, lumbar region with neurogenic claudication: Secondary | ICD-10-CM | POA: Diagnosis not present

## 2020-11-23 DIAGNOSIS — E119 Type 2 diabetes mellitus without complications: Secondary | ICD-10-CM | POA: Diagnosis not present

## 2020-11-23 DIAGNOSIS — M4316 Spondylolisthesis, lumbar region: Secondary | ICD-10-CM | POA: Diagnosis not present

## 2020-11-26 DIAGNOSIS — G4733 Obstructive sleep apnea (adult) (pediatric): Secondary | ICD-10-CM | POA: Diagnosis not present

## 2020-11-26 DIAGNOSIS — E118 Type 2 diabetes mellitus with unspecified complications: Secondary | ICD-10-CM | POA: Diagnosis not present

## 2020-11-30 DIAGNOSIS — E118 Type 2 diabetes mellitus with unspecified complications: Secondary | ICD-10-CM | POA: Diagnosis not present

## 2020-11-30 DIAGNOSIS — R062 Wheezing: Secondary | ICD-10-CM | POA: Diagnosis not present

## 2020-11-30 DIAGNOSIS — Z720 Tobacco use: Secondary | ICD-10-CM | POA: Diagnosis not present

## 2020-11-30 DIAGNOSIS — R059 Cough, unspecified: Secondary | ICD-10-CM | POA: Diagnosis not present

## 2020-12-07 DIAGNOSIS — G43719 Chronic migraine without aura, intractable, without status migrainosus: Secondary | ICD-10-CM | POA: Diagnosis not present

## 2020-12-15 DIAGNOSIS — R946 Abnormal results of thyroid function studies: Secondary | ICD-10-CM | POA: Diagnosis not present

## 2020-12-15 DIAGNOSIS — Z8639 Personal history of other endocrine, nutritional and metabolic disease: Secondary | ICD-10-CM | POA: Diagnosis not present

## 2020-12-21 DIAGNOSIS — M4316 Spondylolisthesis, lumbar region: Secondary | ICD-10-CM | POA: Diagnosis not present

## 2020-12-21 DIAGNOSIS — M48062 Spinal stenosis, lumbar region with neurogenic claudication: Secondary | ICD-10-CM | POA: Diagnosis not present

## 2020-12-21 DIAGNOSIS — Z7409 Other reduced mobility: Secondary | ICD-10-CM | POA: Diagnosis not present

## 2020-12-28 DIAGNOSIS — R1012 Left upper quadrant pain: Secondary | ICD-10-CM | POA: Diagnosis not present

## 2020-12-28 DIAGNOSIS — J441 Chronic obstructive pulmonary disease with (acute) exacerbation: Secondary | ICD-10-CM | POA: Diagnosis not present

## 2020-12-28 DIAGNOSIS — R12 Heartburn: Secondary | ICD-10-CM | POA: Diagnosis not present

## 2020-12-28 DIAGNOSIS — R0602 Shortness of breath: Secondary | ICD-10-CM | POA: Diagnosis not present

## 2020-12-28 DIAGNOSIS — E118 Type 2 diabetes mellitus with unspecified complications: Secondary | ICD-10-CM | POA: Diagnosis not present

## 2020-12-28 DIAGNOSIS — R946 Abnormal results of thyroid function studies: Secondary | ICD-10-CM | POA: Diagnosis not present

## 2020-12-28 DIAGNOSIS — R079 Chest pain, unspecified: Secondary | ICD-10-CM | POA: Diagnosis not present

## 2020-12-28 DIAGNOSIS — R062 Wheezing: Secondary | ICD-10-CM | POA: Diagnosis not present

## 2020-12-28 DIAGNOSIS — F17200 Nicotine dependence, unspecified, uncomplicated: Secondary | ICD-10-CM | POA: Diagnosis not present

## 2020-12-28 DIAGNOSIS — J42 Unspecified chronic bronchitis: Secondary | ICD-10-CM | POA: Diagnosis not present

## 2020-12-28 DIAGNOSIS — R002 Palpitations: Secondary | ICD-10-CM | POA: Diagnosis not present

## 2020-12-28 DIAGNOSIS — R918 Other nonspecific abnormal finding of lung field: Secondary | ICD-10-CM | POA: Diagnosis not present

## 2020-12-28 DIAGNOSIS — R059 Cough, unspecified: Secondary | ICD-10-CM | POA: Diagnosis not present

## 2020-12-28 DIAGNOSIS — J4520 Mild intermittent asthma, uncomplicated: Secondary | ICD-10-CM | POA: Diagnosis not present

## 2020-12-30 DIAGNOSIS — G43709 Chronic migraine without aura, not intractable, without status migrainosus: Secondary | ICD-10-CM | POA: Diagnosis not present

## 2020-12-30 DIAGNOSIS — G8929 Other chronic pain: Secondary | ICD-10-CM | POA: Diagnosis not present

## 2020-12-30 DIAGNOSIS — I10 Essential (primary) hypertension: Secondary | ICD-10-CM | POA: Diagnosis not present

## 2020-12-30 DIAGNOSIS — M533 Sacrococcygeal disorders, not elsewhere classified: Secondary | ICD-10-CM | POA: Diagnosis not present

## 2020-12-30 DIAGNOSIS — M47816 Spondylosis without myelopathy or radiculopathy, lumbar region: Secondary | ICD-10-CM | POA: Diagnosis not present

## 2020-12-30 DIAGNOSIS — Z79899 Other long term (current) drug therapy: Secondary | ICD-10-CM | POA: Diagnosis not present

## 2020-12-30 DIAGNOSIS — M48061 Spinal stenosis, lumbar region without neurogenic claudication: Secondary | ICD-10-CM | POA: Diagnosis not present

## 2020-12-30 DIAGNOSIS — R2689 Other abnormalities of gait and mobility: Secondary | ICD-10-CM | POA: Diagnosis not present

## 2020-12-30 DIAGNOSIS — Z9181 History of falling: Secondary | ICD-10-CM | POA: Diagnosis not present

## 2020-12-30 DIAGNOSIS — Z8673 Personal history of transient ischemic attack (TIA), and cerebral infarction without residual deficits: Secondary | ICD-10-CM | POA: Diagnosis not present

## 2020-12-30 DIAGNOSIS — R110 Nausea: Secondary | ICD-10-CM | POA: Diagnosis not present

## 2020-12-30 DIAGNOSIS — F17210 Nicotine dependence, cigarettes, uncomplicated: Secondary | ICD-10-CM | POA: Diagnosis not present

## 2020-12-30 DIAGNOSIS — G43909 Migraine, unspecified, not intractable, without status migrainosus: Secondary | ICD-10-CM | POA: Diagnosis not present

## 2020-12-30 DIAGNOSIS — M25561 Pain in right knee: Secondary | ICD-10-CM | POA: Diagnosis not present

## 2021-01-01 DIAGNOSIS — M85852 Other specified disorders of bone density and structure, left thigh: Secondary | ICD-10-CM | POA: Diagnosis not present

## 2021-01-01 DIAGNOSIS — Z780 Asymptomatic menopausal state: Secondary | ICD-10-CM | POA: Diagnosis not present

## 2021-01-02 DIAGNOSIS — R002 Palpitations: Secondary | ICD-10-CM | POA: Diagnosis not present

## 2021-01-02 DIAGNOSIS — R079 Chest pain, unspecified: Secondary | ICD-10-CM | POA: Diagnosis not present

## 2021-01-02 DIAGNOSIS — R0602 Shortness of breath: Secondary | ICD-10-CM | POA: Diagnosis not present

## 2021-01-04 DIAGNOSIS — M62830 Muscle spasm of back: Secondary | ICD-10-CM | POA: Diagnosis not present

## 2021-01-04 DIAGNOSIS — R12 Heartburn: Secondary | ICD-10-CM | POA: Diagnosis not present

## 2021-01-04 DIAGNOSIS — F17200 Nicotine dependence, unspecified, uncomplicated: Secondary | ICD-10-CM | POA: Diagnosis not present

## 2021-01-04 DIAGNOSIS — E118 Type 2 diabetes mellitus with unspecified complications: Secondary | ICD-10-CM | POA: Diagnosis not present

## 2021-01-04 DIAGNOSIS — J441 Chronic obstructive pulmonary disease with (acute) exacerbation: Secondary | ICD-10-CM | POA: Diagnosis not present

## 2021-01-04 DIAGNOSIS — M8589 Other specified disorders of bone density and structure, multiple sites: Secondary | ICD-10-CM | POA: Diagnosis not present

## 2021-01-05 DIAGNOSIS — M47816 Spondylosis without myelopathy or radiculopathy, lumbar region: Secondary | ICD-10-CM | POA: Diagnosis not present

## 2021-01-05 DIAGNOSIS — J449 Chronic obstructive pulmonary disease, unspecified: Secondary | ICD-10-CM | POA: Diagnosis not present

## 2021-01-05 DIAGNOSIS — I272 Pulmonary hypertension, unspecified: Secondary | ICD-10-CM | POA: Diagnosis not present

## 2021-01-05 DIAGNOSIS — I10 Essential (primary) hypertension: Secondary | ICD-10-CM | POA: Diagnosis not present

## 2021-01-05 DIAGNOSIS — Z794 Long term (current) use of insulin: Secondary | ICD-10-CM | POA: Diagnosis not present

## 2021-01-05 DIAGNOSIS — Z7984 Long term (current) use of oral hypoglycemic drugs: Secondary | ICD-10-CM | POA: Diagnosis not present

## 2021-01-05 DIAGNOSIS — Z9181 History of falling: Secondary | ICD-10-CM | POA: Diagnosis not present

## 2021-01-05 DIAGNOSIS — E1140 Type 2 diabetes mellitus with diabetic neuropathy, unspecified: Secondary | ICD-10-CM | POA: Diagnosis not present

## 2021-01-05 DIAGNOSIS — G43909 Migraine, unspecified, not intractable, without status migrainosus: Secondary | ICD-10-CM | POA: Diagnosis not present

## 2021-01-05 DIAGNOSIS — K589 Irritable bowel syndrome without diarrhea: Secondary | ICD-10-CM | POA: Diagnosis not present

## 2021-01-05 DIAGNOSIS — Z79899 Other long term (current) drug therapy: Secondary | ICD-10-CM | POA: Diagnosis not present

## 2021-01-05 DIAGNOSIS — E785 Hyperlipidemia, unspecified: Secondary | ICD-10-CM | POA: Diagnosis not present

## 2021-01-05 DIAGNOSIS — F17210 Nicotine dependence, cigarettes, uncomplicated: Secondary | ICD-10-CM | POA: Diagnosis not present

## 2021-01-05 DIAGNOSIS — G4733 Obstructive sleep apnea (adult) (pediatric): Secondary | ICD-10-CM | POA: Diagnosis not present

## 2021-01-05 DIAGNOSIS — I2510 Atherosclerotic heart disease of native coronary artery without angina pectoris: Secondary | ICD-10-CM | POA: Diagnosis not present

## 2021-01-05 DIAGNOSIS — G8929 Other chronic pain: Secondary | ICD-10-CM | POA: Diagnosis not present

## 2021-01-14 DIAGNOSIS — E785 Hyperlipidemia, unspecified: Secondary | ICD-10-CM | POA: Diagnosis not present

## 2021-01-14 DIAGNOSIS — G43909 Migraine, unspecified, not intractable, without status migrainosus: Secondary | ICD-10-CM | POA: Diagnosis not present

## 2021-01-14 DIAGNOSIS — Z79899 Other long term (current) drug therapy: Secondary | ICD-10-CM | POA: Diagnosis not present

## 2021-01-14 DIAGNOSIS — I10 Essential (primary) hypertension: Secondary | ICD-10-CM | POA: Diagnosis not present

## 2021-01-14 DIAGNOSIS — F17210 Nicotine dependence, cigarettes, uncomplicated: Secondary | ICD-10-CM | POA: Diagnosis not present

## 2021-01-14 DIAGNOSIS — Z9181 History of falling: Secondary | ICD-10-CM | POA: Diagnosis not present

## 2021-01-14 DIAGNOSIS — G8929 Other chronic pain: Secondary | ICD-10-CM | POA: Diagnosis not present

## 2021-01-14 DIAGNOSIS — I2510 Atherosclerotic heart disease of native coronary artery without angina pectoris: Secondary | ICD-10-CM | POA: Diagnosis not present

## 2021-01-14 DIAGNOSIS — G4733 Obstructive sleep apnea (adult) (pediatric): Secondary | ICD-10-CM | POA: Diagnosis not present

## 2021-01-14 DIAGNOSIS — K589 Irritable bowel syndrome without diarrhea: Secondary | ICD-10-CM | POA: Diagnosis not present

## 2021-01-14 DIAGNOSIS — M47816 Spondylosis without myelopathy or radiculopathy, lumbar region: Secondary | ICD-10-CM | POA: Diagnosis not present

## 2021-01-14 DIAGNOSIS — I272 Pulmonary hypertension, unspecified: Secondary | ICD-10-CM | POA: Diagnosis not present

## 2021-01-14 DIAGNOSIS — E1140 Type 2 diabetes mellitus with diabetic neuropathy, unspecified: Secondary | ICD-10-CM | POA: Diagnosis not present

## 2021-01-14 DIAGNOSIS — J449 Chronic obstructive pulmonary disease, unspecified: Secondary | ICD-10-CM | POA: Diagnosis not present

## 2021-01-14 DIAGNOSIS — Z7984 Long term (current) use of oral hypoglycemic drugs: Secondary | ICD-10-CM | POA: Diagnosis not present

## 2021-01-14 DIAGNOSIS — Z794 Long term (current) use of insulin: Secondary | ICD-10-CM | POA: Diagnosis not present

## 2021-01-21 DIAGNOSIS — K589 Irritable bowel syndrome without diarrhea: Secondary | ICD-10-CM | POA: Diagnosis not present

## 2021-01-21 DIAGNOSIS — I2510 Atherosclerotic heart disease of native coronary artery without angina pectoris: Secondary | ICD-10-CM | POA: Diagnosis not present

## 2021-01-21 DIAGNOSIS — G8929 Other chronic pain: Secondary | ICD-10-CM | POA: Diagnosis not present

## 2021-01-21 DIAGNOSIS — E1140 Type 2 diabetes mellitus with diabetic neuropathy, unspecified: Secondary | ICD-10-CM | POA: Diagnosis not present

## 2021-01-21 DIAGNOSIS — Z79899 Other long term (current) drug therapy: Secondary | ICD-10-CM | POA: Diagnosis not present

## 2021-01-21 DIAGNOSIS — I10 Essential (primary) hypertension: Secondary | ICD-10-CM | POA: Diagnosis not present

## 2021-01-21 DIAGNOSIS — I272 Pulmonary hypertension, unspecified: Secondary | ICD-10-CM | POA: Diagnosis not present

## 2021-01-21 DIAGNOSIS — F17210 Nicotine dependence, cigarettes, uncomplicated: Secondary | ICD-10-CM | POA: Diagnosis not present

## 2021-01-21 DIAGNOSIS — E785 Hyperlipidemia, unspecified: Secondary | ICD-10-CM | POA: Diagnosis not present

## 2021-01-21 DIAGNOSIS — M47816 Spondylosis without myelopathy or radiculopathy, lumbar region: Secondary | ICD-10-CM | POA: Diagnosis not present

## 2021-01-21 DIAGNOSIS — J449 Chronic obstructive pulmonary disease, unspecified: Secondary | ICD-10-CM | POA: Diagnosis not present

## 2021-01-21 DIAGNOSIS — Z7984 Long term (current) use of oral hypoglycemic drugs: Secondary | ICD-10-CM | POA: Diagnosis not present

## 2021-01-21 DIAGNOSIS — G4733 Obstructive sleep apnea (adult) (pediatric): Secondary | ICD-10-CM | POA: Diagnosis not present

## 2021-01-21 DIAGNOSIS — Z9181 History of falling: Secondary | ICD-10-CM | POA: Diagnosis not present

## 2021-01-21 DIAGNOSIS — G43909 Migraine, unspecified, not intractable, without status migrainosus: Secondary | ICD-10-CM | POA: Diagnosis not present

## 2021-01-21 DIAGNOSIS — Z794 Long term (current) use of insulin: Secondary | ICD-10-CM | POA: Diagnosis not present

## 2021-01-25 DIAGNOSIS — F17210 Nicotine dependence, cigarettes, uncomplicated: Secondary | ICD-10-CM | POA: Diagnosis not present

## 2021-01-25 DIAGNOSIS — E785 Hyperlipidemia, unspecified: Secondary | ICD-10-CM | POA: Diagnosis not present

## 2021-01-25 DIAGNOSIS — J449 Chronic obstructive pulmonary disease, unspecified: Secondary | ICD-10-CM | POA: Diagnosis not present

## 2021-01-25 DIAGNOSIS — K589 Irritable bowel syndrome without diarrhea: Secondary | ICD-10-CM | POA: Diagnosis not present

## 2021-01-25 DIAGNOSIS — E1140 Type 2 diabetes mellitus with diabetic neuropathy, unspecified: Secondary | ICD-10-CM | POA: Diagnosis not present

## 2021-01-25 DIAGNOSIS — Z9181 History of falling: Secondary | ICD-10-CM | POA: Diagnosis not present

## 2021-01-25 DIAGNOSIS — I2510 Atherosclerotic heart disease of native coronary artery without angina pectoris: Secondary | ICD-10-CM | POA: Diagnosis not present

## 2021-01-25 DIAGNOSIS — G43909 Migraine, unspecified, not intractable, without status migrainosus: Secondary | ICD-10-CM | POA: Diagnosis not present

## 2021-01-25 DIAGNOSIS — I272 Pulmonary hypertension, unspecified: Secondary | ICD-10-CM | POA: Diagnosis not present

## 2021-01-25 DIAGNOSIS — M47816 Spondylosis without myelopathy or radiculopathy, lumbar region: Secondary | ICD-10-CM | POA: Diagnosis not present

## 2021-01-25 DIAGNOSIS — Z7984 Long term (current) use of oral hypoglycemic drugs: Secondary | ICD-10-CM | POA: Diagnosis not present

## 2021-01-25 DIAGNOSIS — I10 Essential (primary) hypertension: Secondary | ICD-10-CM | POA: Diagnosis not present

## 2021-01-25 DIAGNOSIS — G4733 Obstructive sleep apnea (adult) (pediatric): Secondary | ICD-10-CM | POA: Diagnosis not present

## 2021-01-25 DIAGNOSIS — G8929 Other chronic pain: Secondary | ICD-10-CM | POA: Diagnosis not present

## 2021-01-25 DIAGNOSIS — Z794 Long term (current) use of insulin: Secondary | ICD-10-CM | POA: Diagnosis not present

## 2021-01-25 DIAGNOSIS — Z79899 Other long term (current) drug therapy: Secondary | ICD-10-CM | POA: Diagnosis not present

## 2021-01-28 DIAGNOSIS — J441 Chronic obstructive pulmonary disease with (acute) exacerbation: Secondary | ICD-10-CM | POA: Diagnosis not present

## 2021-02-01 DIAGNOSIS — F17210 Nicotine dependence, cigarettes, uncomplicated: Secondary | ICD-10-CM | POA: Diagnosis not present

## 2021-02-01 DIAGNOSIS — E785 Hyperlipidemia, unspecified: Secondary | ICD-10-CM | POA: Diagnosis not present

## 2021-02-01 DIAGNOSIS — I272 Pulmonary hypertension, unspecified: Secondary | ICD-10-CM | POA: Diagnosis not present

## 2021-02-01 DIAGNOSIS — K589 Irritable bowel syndrome without diarrhea: Secondary | ICD-10-CM | POA: Diagnosis not present

## 2021-02-01 DIAGNOSIS — Z79899 Other long term (current) drug therapy: Secondary | ICD-10-CM | POA: Diagnosis not present

## 2021-02-01 DIAGNOSIS — R002 Palpitations: Secondary | ICD-10-CM | POA: Diagnosis not present

## 2021-02-01 DIAGNOSIS — G4733 Obstructive sleep apnea (adult) (pediatric): Secondary | ICD-10-CM | POA: Diagnosis not present

## 2021-02-01 DIAGNOSIS — G43909 Migraine, unspecified, not intractable, without status migrainosus: Secondary | ICD-10-CM | POA: Diagnosis not present

## 2021-02-01 DIAGNOSIS — G8929 Other chronic pain: Secondary | ICD-10-CM | POA: Diagnosis not present

## 2021-02-01 DIAGNOSIS — I25119 Atherosclerotic heart disease of native coronary artery with unspecified angina pectoris: Secondary | ICD-10-CM | POA: Diagnosis not present

## 2021-02-01 DIAGNOSIS — E1140 Type 2 diabetes mellitus with diabetic neuropathy, unspecified: Secondary | ICD-10-CM | POA: Diagnosis not present

## 2021-02-01 DIAGNOSIS — F17200 Nicotine dependence, unspecified, uncomplicated: Secondary | ICD-10-CM | POA: Diagnosis not present

## 2021-02-01 DIAGNOSIS — M47816 Spondylosis without myelopathy or radiculopathy, lumbar region: Secondary | ICD-10-CM | POA: Diagnosis not present

## 2021-02-01 DIAGNOSIS — Z7984 Long term (current) use of oral hypoglycemic drugs: Secondary | ICD-10-CM | POA: Diagnosis not present

## 2021-02-01 DIAGNOSIS — I10 Essential (primary) hypertension: Secondary | ICD-10-CM | POA: Diagnosis not present

## 2021-02-01 DIAGNOSIS — Z720 Tobacco use: Secondary | ICD-10-CM | POA: Diagnosis not present

## 2021-02-01 DIAGNOSIS — R0602 Shortness of breath: Secondary | ICD-10-CM | POA: Diagnosis not present

## 2021-02-01 DIAGNOSIS — Z794 Long term (current) use of insulin: Secondary | ICD-10-CM | POA: Diagnosis not present

## 2021-02-01 DIAGNOSIS — J42 Unspecified chronic bronchitis: Secondary | ICD-10-CM | POA: Diagnosis not present

## 2021-02-01 DIAGNOSIS — Z9181 History of falling: Secondary | ICD-10-CM | POA: Diagnosis not present

## 2021-02-01 DIAGNOSIS — R079 Chest pain, unspecified: Secondary | ICD-10-CM | POA: Diagnosis not present

## 2021-02-01 DIAGNOSIS — R0600 Dyspnea, unspecified: Secondary | ICD-10-CM | POA: Diagnosis not present

## 2021-02-01 DIAGNOSIS — J449 Chronic obstructive pulmonary disease, unspecified: Secondary | ICD-10-CM | POA: Diagnosis not present

## 2021-02-01 DIAGNOSIS — I2510 Atherosclerotic heart disease of native coronary artery without angina pectoris: Secondary | ICD-10-CM | POA: Diagnosis not present

## 2021-02-08 DIAGNOSIS — R1013 Epigastric pain: Secondary | ICD-10-CM | POA: Diagnosis not present

## 2021-02-08 DIAGNOSIS — I10 Essential (primary) hypertension: Secondary | ICD-10-CM | POA: Diagnosis not present

## 2021-02-11 DIAGNOSIS — K589 Irritable bowel syndrome without diarrhea: Secondary | ICD-10-CM | POA: Diagnosis not present

## 2021-02-11 DIAGNOSIS — E1140 Type 2 diabetes mellitus with diabetic neuropathy, unspecified: Secondary | ICD-10-CM | POA: Diagnosis not present

## 2021-02-11 DIAGNOSIS — I10 Essential (primary) hypertension: Secondary | ICD-10-CM | POA: Diagnosis not present

## 2021-02-11 DIAGNOSIS — I2510 Atherosclerotic heart disease of native coronary artery without angina pectoris: Secondary | ICD-10-CM | POA: Diagnosis not present

## 2021-02-11 DIAGNOSIS — Z9181 History of falling: Secondary | ICD-10-CM | POA: Diagnosis not present

## 2021-02-11 DIAGNOSIS — Z79899 Other long term (current) drug therapy: Secondary | ICD-10-CM | POA: Diagnosis not present

## 2021-02-11 DIAGNOSIS — M47816 Spondylosis without myelopathy or radiculopathy, lumbar region: Secondary | ICD-10-CM | POA: Diagnosis not present

## 2021-02-11 DIAGNOSIS — G8929 Other chronic pain: Secondary | ICD-10-CM | POA: Diagnosis not present

## 2021-02-11 DIAGNOSIS — F17210 Nicotine dependence, cigarettes, uncomplicated: Secondary | ICD-10-CM | POA: Diagnosis not present

## 2021-02-11 DIAGNOSIS — G4733 Obstructive sleep apnea (adult) (pediatric): Secondary | ICD-10-CM | POA: Diagnosis not present

## 2021-02-11 DIAGNOSIS — E785 Hyperlipidemia, unspecified: Secondary | ICD-10-CM | POA: Diagnosis not present

## 2021-02-11 DIAGNOSIS — Z794 Long term (current) use of insulin: Secondary | ICD-10-CM | POA: Diagnosis not present

## 2021-02-11 DIAGNOSIS — Z7984 Long term (current) use of oral hypoglycemic drugs: Secondary | ICD-10-CM | POA: Diagnosis not present

## 2021-02-11 DIAGNOSIS — J449 Chronic obstructive pulmonary disease, unspecified: Secondary | ICD-10-CM | POA: Diagnosis not present

## 2021-02-11 DIAGNOSIS — R0609 Other forms of dyspnea: Secondary | ICD-10-CM | POA: Diagnosis not present

## 2021-02-11 DIAGNOSIS — I272 Pulmonary hypertension, unspecified: Secondary | ICD-10-CM | POA: Diagnosis not present

## 2021-02-11 DIAGNOSIS — F17200 Nicotine dependence, unspecified, uncomplicated: Secondary | ICD-10-CM | POA: Diagnosis not present

## 2021-02-11 DIAGNOSIS — G43909 Migraine, unspecified, not intractable, without status migrainosus: Secondary | ICD-10-CM | POA: Diagnosis not present

## 2021-02-11 DIAGNOSIS — R062 Wheezing: Secondary | ICD-10-CM | POA: Diagnosis not present

## 2021-02-15 DIAGNOSIS — E1169 Type 2 diabetes mellitus with other specified complication: Secondary | ICD-10-CM | POA: Diagnosis not present

## 2021-02-15 DIAGNOSIS — Z7984 Long term (current) use of oral hypoglycemic drugs: Secondary | ICD-10-CM | POA: Diagnosis not present

## 2021-02-15 DIAGNOSIS — F17210 Nicotine dependence, cigarettes, uncomplicated: Secondary | ICD-10-CM | POA: Diagnosis not present

## 2021-02-25 DIAGNOSIS — G4733 Obstructive sleep apnea (adult) (pediatric): Secondary | ICD-10-CM | POA: Diagnosis not present

## 2021-03-01 DIAGNOSIS — G43719 Chronic migraine without aura, intractable, without status migrainosus: Secondary | ICD-10-CM | POA: Diagnosis not present

## 2021-03-09 DIAGNOSIS — M5430 Sciatica, unspecified side: Secondary | ICD-10-CM | POA: Diagnosis not present

## 2021-03-11 DIAGNOSIS — J449 Chronic obstructive pulmonary disease, unspecified: Secondary | ICD-10-CM | POA: Diagnosis not present

## 2021-03-11 DIAGNOSIS — G8929 Other chronic pain: Secondary | ICD-10-CM | POA: Diagnosis not present

## 2021-03-11 DIAGNOSIS — I272 Pulmonary hypertension, unspecified: Secondary | ICD-10-CM | POA: Diagnosis not present

## 2021-03-11 DIAGNOSIS — G43909 Migraine, unspecified, not intractable, without status migrainosus: Secondary | ICD-10-CM | POA: Diagnosis not present

## 2021-03-11 DIAGNOSIS — G4733 Obstructive sleep apnea (adult) (pediatric): Secondary | ICD-10-CM | POA: Diagnosis not present

## 2021-03-11 DIAGNOSIS — Z7984 Long term (current) use of oral hypoglycemic drugs: Secondary | ICD-10-CM | POA: Diagnosis not present

## 2021-03-11 DIAGNOSIS — I10 Essential (primary) hypertension: Secondary | ICD-10-CM | POA: Diagnosis not present

## 2021-03-11 DIAGNOSIS — F17210 Nicotine dependence, cigarettes, uncomplicated: Secondary | ICD-10-CM | POA: Diagnosis not present

## 2021-03-11 DIAGNOSIS — Z79899 Other long term (current) drug therapy: Secondary | ICD-10-CM | POA: Diagnosis not present

## 2021-03-11 DIAGNOSIS — K589 Irritable bowel syndrome without diarrhea: Secondary | ICD-10-CM | POA: Diagnosis not present

## 2021-03-11 DIAGNOSIS — E785 Hyperlipidemia, unspecified: Secondary | ICD-10-CM | POA: Diagnosis not present

## 2021-03-11 DIAGNOSIS — E1140 Type 2 diabetes mellitus with diabetic neuropathy, unspecified: Secondary | ICD-10-CM | POA: Diagnosis not present

## 2021-03-11 DIAGNOSIS — Z9181 History of falling: Secondary | ICD-10-CM | POA: Diagnosis not present

## 2021-03-11 DIAGNOSIS — I2510 Atherosclerotic heart disease of native coronary artery without angina pectoris: Secondary | ICD-10-CM | POA: Diagnosis not present

## 2021-03-11 DIAGNOSIS — M47816 Spondylosis without myelopathy or radiculopathy, lumbar region: Secondary | ICD-10-CM | POA: Diagnosis not present

## 2021-03-11 DIAGNOSIS — Z794 Long term (current) use of insulin: Secondary | ICD-10-CM | POA: Diagnosis not present

## 2021-03-31 DIAGNOSIS — J441 Chronic obstructive pulmonary disease with (acute) exacerbation: Secondary | ICD-10-CM | POA: Diagnosis not present

## 2021-04-08 DIAGNOSIS — M47816 Spondylosis without myelopathy or radiculopathy, lumbar region: Secondary | ICD-10-CM | POA: Diagnosis not present

## 2021-04-08 DIAGNOSIS — F17210 Nicotine dependence, cigarettes, uncomplicated: Secondary | ICD-10-CM | POA: Diagnosis not present

## 2021-04-08 DIAGNOSIS — I272 Pulmonary hypertension, unspecified: Secondary | ICD-10-CM | POA: Diagnosis not present

## 2021-04-08 DIAGNOSIS — E785 Hyperlipidemia, unspecified: Secondary | ICD-10-CM | POA: Diagnosis not present

## 2021-04-08 DIAGNOSIS — Z9181 History of falling: Secondary | ICD-10-CM | POA: Diagnosis not present

## 2021-04-08 DIAGNOSIS — J449 Chronic obstructive pulmonary disease, unspecified: Secondary | ICD-10-CM | POA: Diagnosis not present

## 2021-04-08 DIAGNOSIS — G8929 Other chronic pain: Secondary | ICD-10-CM | POA: Diagnosis not present

## 2021-04-08 DIAGNOSIS — G43909 Migraine, unspecified, not intractable, without status migrainosus: Secondary | ICD-10-CM | POA: Diagnosis not present

## 2021-04-08 DIAGNOSIS — G4733 Obstructive sleep apnea (adult) (pediatric): Secondary | ICD-10-CM | POA: Diagnosis not present

## 2021-04-08 DIAGNOSIS — K589 Irritable bowel syndrome without diarrhea: Secondary | ICD-10-CM | POA: Diagnosis not present

## 2021-04-08 DIAGNOSIS — I2510 Atherosclerotic heart disease of native coronary artery without angina pectoris: Secondary | ICD-10-CM | POA: Diagnosis not present

## 2021-04-08 DIAGNOSIS — Z79899 Other long term (current) drug therapy: Secondary | ICD-10-CM | POA: Diagnosis not present

## 2021-04-08 DIAGNOSIS — Z7984 Long term (current) use of oral hypoglycemic drugs: Secondary | ICD-10-CM | POA: Diagnosis not present

## 2021-04-08 DIAGNOSIS — I119 Hypertensive heart disease without heart failure: Secondary | ICD-10-CM | POA: Diagnosis not present

## 2021-04-08 DIAGNOSIS — E1140 Type 2 diabetes mellitus with diabetic neuropathy, unspecified: Secondary | ICD-10-CM | POA: Diagnosis not present

## 2021-04-08 DIAGNOSIS — Z794 Long term (current) use of insulin: Secondary | ICD-10-CM | POA: Diagnosis not present

## 2021-04-11 DIAGNOSIS — K219 Gastro-esophageal reflux disease without esophagitis: Secondary | ICD-10-CM | POA: Diagnosis not present

## 2021-04-11 DIAGNOSIS — Z20822 Contact with and (suspected) exposure to covid-19: Secondary | ICD-10-CM | POA: Diagnosis not present

## 2021-04-15 DIAGNOSIS — K219 Gastro-esophageal reflux disease without esophagitis: Secondary | ICD-10-CM | POA: Diagnosis not present

## 2021-04-15 DIAGNOSIS — R1013 Epigastric pain: Secondary | ICD-10-CM | POA: Diagnosis not present

## 2021-04-15 DIAGNOSIS — R0602 Shortness of breath: Secondary | ICD-10-CM | POA: Diagnosis not present

## 2021-04-15 DIAGNOSIS — J441 Chronic obstructive pulmonary disease with (acute) exacerbation: Secondary | ICD-10-CM | POA: Diagnosis not present

## 2021-04-15 DIAGNOSIS — R0989 Other specified symptoms and signs involving the circulatory and respiratory systems: Secondary | ICD-10-CM | POA: Diagnosis not present

## 2021-04-15 DIAGNOSIS — D649 Anemia, unspecified: Secondary | ICD-10-CM | POA: Diagnosis not present

## 2021-04-15 DIAGNOSIS — E785 Hyperlipidemia, unspecified: Secondary | ICD-10-CM | POA: Diagnosis not present

## 2021-04-15 DIAGNOSIS — E118 Type 2 diabetes mellitus with unspecified complications: Secondary | ICD-10-CM | POA: Diagnosis not present

## 2021-04-15 DIAGNOSIS — R12 Heartburn: Secondary | ICD-10-CM | POA: Diagnosis not present

## 2021-04-15 DIAGNOSIS — K5900 Constipation, unspecified: Secondary | ICD-10-CM | POA: Diagnosis not present

## 2021-04-15 DIAGNOSIS — R197 Diarrhea, unspecified: Secondary | ICD-10-CM | POA: Diagnosis not present

## 2021-04-15 DIAGNOSIS — F17200 Nicotine dependence, unspecified, uncomplicated: Secondary | ICD-10-CM | POA: Diagnosis not present

## 2021-04-15 DIAGNOSIS — R059 Cough, unspecified: Secondary | ICD-10-CM | POA: Diagnosis not present

## 2021-04-16 DIAGNOSIS — D500 Iron deficiency anemia secondary to blood loss (chronic): Secondary | ICD-10-CM | POA: Diagnosis not present

## 2021-04-16 DIAGNOSIS — J441 Chronic obstructive pulmonary disease with (acute) exacerbation: Secondary | ICD-10-CM | POA: Diagnosis not present

## 2021-04-16 DIAGNOSIS — K5900 Constipation, unspecified: Secondary | ICD-10-CM | POA: Diagnosis not present

## 2021-04-16 DIAGNOSIS — R12 Heartburn: Secondary | ICD-10-CM | POA: Diagnosis not present

## 2021-04-16 DIAGNOSIS — F17200 Nicotine dependence, unspecified, uncomplicated: Secondary | ICD-10-CM | POA: Diagnosis not present

## 2021-04-16 DIAGNOSIS — E118 Type 2 diabetes mellitus with unspecified complications: Secondary | ICD-10-CM | POA: Diagnosis not present

## 2021-04-19 DIAGNOSIS — I10 Essential (primary) hypertension: Secondary | ICD-10-CM | POA: Diagnosis not present

## 2021-04-19 DIAGNOSIS — Z9119 Patient's noncompliance with other medical treatment and regimen: Secondary | ICD-10-CM | POA: Diagnosis not present

## 2021-04-19 DIAGNOSIS — I252 Old myocardial infarction: Secondary | ICD-10-CM | POA: Diagnosis not present

## 2021-04-19 DIAGNOSIS — G43719 Chronic migraine without aura, intractable, without status migrainosus: Secondary | ICD-10-CM | POA: Diagnosis not present

## 2021-04-19 DIAGNOSIS — Z7984 Long term (current) use of oral hypoglycemic drugs: Secondary | ICD-10-CM | POA: Diagnosis not present

## 2021-04-19 DIAGNOSIS — G43009 Migraine without aura, not intractable, without status migrainosus: Secondary | ICD-10-CM | POA: Diagnosis not present

## 2021-04-19 DIAGNOSIS — E1142 Type 2 diabetes mellitus with diabetic polyneuropathy: Secondary | ICD-10-CM | POA: Diagnosis not present

## 2021-04-19 DIAGNOSIS — K589 Irritable bowel syndrome without diarrhea: Secondary | ICD-10-CM | POA: Diagnosis not present

## 2021-04-19 DIAGNOSIS — K219 Gastro-esophageal reflux disease without esophagitis: Secondary | ICD-10-CM | POA: Diagnosis not present

## 2021-04-19 DIAGNOSIS — R29898 Other symptoms and signs involving the musculoskeletal system: Secondary | ICD-10-CM | POA: Diagnosis not present

## 2021-04-19 DIAGNOSIS — J449 Chronic obstructive pulmonary disease, unspecified: Secondary | ICD-10-CM | POA: Diagnosis not present

## 2021-04-19 DIAGNOSIS — Z955 Presence of coronary angioplasty implant and graft: Secondary | ICD-10-CM | POA: Diagnosis not present

## 2021-04-19 DIAGNOSIS — R29810 Facial weakness: Secondary | ICD-10-CM | POA: Diagnosis not present

## 2021-04-19 DIAGNOSIS — Z8673 Personal history of transient ischemic attack (TIA), and cerebral infarction without residual deficits: Secondary | ICD-10-CM | POA: Diagnosis not present

## 2021-04-19 DIAGNOSIS — F17210 Nicotine dependence, cigarettes, uncomplicated: Secondary | ICD-10-CM | POA: Diagnosis not present

## 2021-04-19 DIAGNOSIS — R5383 Other fatigue: Secondary | ICD-10-CM | POA: Diagnosis not present

## 2021-04-19 DIAGNOSIS — G4733 Obstructive sleep apnea (adult) (pediatric): Secondary | ICD-10-CM | POA: Diagnosis not present

## 2021-04-19 DIAGNOSIS — E785 Hyperlipidemia, unspecified: Secondary | ICD-10-CM | POA: Diagnosis not present

## 2021-04-19 DIAGNOSIS — F17200 Nicotine dependence, unspecified, uncomplicated: Secondary | ICD-10-CM | POA: Diagnosis not present

## 2021-04-20 DIAGNOSIS — Z7984 Long term (current) use of oral hypoglycemic drugs: Secondary | ICD-10-CM | POA: Diagnosis not present

## 2021-04-20 DIAGNOSIS — E118 Type 2 diabetes mellitus with unspecified complications: Secondary | ICD-10-CM | POA: Diagnosis not present

## 2021-04-22 DIAGNOSIS — H2513 Age-related nuclear cataract, bilateral: Secondary | ICD-10-CM | POA: Diagnosis not present

## 2021-04-22 DIAGNOSIS — E119 Type 2 diabetes mellitus without complications: Secondary | ICD-10-CM | POA: Diagnosis not present

## 2021-04-22 DIAGNOSIS — Z961 Presence of intraocular lens: Secondary | ICD-10-CM | POA: Diagnosis not present

## 2021-05-06 DIAGNOSIS — Z794 Long term (current) use of insulin: Secondary | ICD-10-CM | POA: Diagnosis not present

## 2021-05-06 DIAGNOSIS — K589 Irritable bowel syndrome without diarrhea: Secondary | ICD-10-CM | POA: Diagnosis not present

## 2021-05-06 DIAGNOSIS — J449 Chronic obstructive pulmonary disease, unspecified: Secondary | ICD-10-CM | POA: Diagnosis not present

## 2021-05-06 DIAGNOSIS — F17210 Nicotine dependence, cigarettes, uncomplicated: Secondary | ICD-10-CM | POA: Diagnosis not present

## 2021-05-06 DIAGNOSIS — M47816 Spondylosis without myelopathy or radiculopathy, lumbar region: Secondary | ICD-10-CM | POA: Diagnosis not present

## 2021-05-06 DIAGNOSIS — Z79899 Other long term (current) drug therapy: Secondary | ICD-10-CM | POA: Diagnosis not present

## 2021-05-06 DIAGNOSIS — Z7984 Long term (current) use of oral hypoglycemic drugs: Secondary | ICD-10-CM | POA: Diagnosis not present

## 2021-05-06 DIAGNOSIS — G43909 Migraine, unspecified, not intractable, without status migrainosus: Secondary | ICD-10-CM | POA: Diagnosis not present

## 2021-05-06 DIAGNOSIS — I272 Pulmonary hypertension, unspecified: Secondary | ICD-10-CM | POA: Diagnosis not present

## 2021-05-06 DIAGNOSIS — G4733 Obstructive sleep apnea (adult) (pediatric): Secondary | ICD-10-CM | POA: Diagnosis not present

## 2021-05-06 DIAGNOSIS — G8929 Other chronic pain: Secondary | ICD-10-CM | POA: Diagnosis not present

## 2021-05-06 DIAGNOSIS — E1140 Type 2 diabetes mellitus with diabetic neuropathy, unspecified: Secondary | ICD-10-CM | POA: Diagnosis not present

## 2021-05-06 DIAGNOSIS — Z9181 History of falling: Secondary | ICD-10-CM | POA: Diagnosis not present

## 2021-05-06 DIAGNOSIS — I119 Hypertensive heart disease without heart failure: Secondary | ICD-10-CM | POA: Diagnosis not present

## 2021-05-06 DIAGNOSIS — E785 Hyperlipidemia, unspecified: Secondary | ICD-10-CM | POA: Diagnosis not present

## 2021-05-06 DIAGNOSIS — I2510 Atherosclerotic heart disease of native coronary artery without angina pectoris: Secondary | ICD-10-CM | POA: Diagnosis not present

## 2021-05-11 DIAGNOSIS — Z79899 Other long term (current) drug therapy: Secondary | ICD-10-CM | POA: Diagnosis not present

## 2021-05-11 DIAGNOSIS — G43709 Chronic migraine without aura, not intractable, without status migrainosus: Secondary | ICD-10-CM | POA: Diagnosis not present

## 2021-05-11 DIAGNOSIS — G4440 Drug-induced headache, not elsewhere classified, not intractable: Secondary | ICD-10-CM | POA: Diagnosis not present

## 2021-05-11 DIAGNOSIS — Z880 Allergy status to penicillin: Secondary | ICD-10-CM | POA: Diagnosis not present

## 2021-05-11 DIAGNOSIS — F17210 Nicotine dependence, cigarettes, uncomplicated: Secondary | ICD-10-CM | POA: Diagnosis not present

## 2021-05-11 DIAGNOSIS — Z888 Allergy status to other drugs, medicaments and biological substances status: Secondary | ICD-10-CM | POA: Diagnosis not present

## 2021-05-11 DIAGNOSIS — Z881 Allergy status to other antibiotic agents status: Secondary | ICD-10-CM | POA: Diagnosis not present

## 2021-05-11 DIAGNOSIS — Z882 Allergy status to sulfonamides status: Secondary | ICD-10-CM | POA: Diagnosis not present

## 2021-05-11 DIAGNOSIS — F17200 Nicotine dependence, unspecified, uncomplicated: Secondary | ICD-10-CM | POA: Diagnosis not present

## 2021-05-12 DIAGNOSIS — M47816 Spondylosis without myelopathy or radiculopathy, lumbar region: Secondary | ICD-10-CM | POA: Diagnosis not present

## 2021-05-12 DIAGNOSIS — I119 Hypertensive heart disease without heart failure: Secondary | ICD-10-CM | POA: Diagnosis not present

## 2021-05-12 DIAGNOSIS — J449 Chronic obstructive pulmonary disease, unspecified: Secondary | ICD-10-CM | POA: Diagnosis not present

## 2021-05-12 DIAGNOSIS — I2510 Atherosclerotic heart disease of native coronary artery without angina pectoris: Secondary | ICD-10-CM | POA: Diagnosis not present

## 2021-05-12 DIAGNOSIS — I272 Pulmonary hypertension, unspecified: Secondary | ICD-10-CM | POA: Diagnosis not present

## 2021-05-12 DIAGNOSIS — E785 Hyperlipidemia, unspecified: Secondary | ICD-10-CM | POA: Diagnosis not present

## 2021-05-12 DIAGNOSIS — G43909 Migraine, unspecified, not intractable, without status migrainosus: Secondary | ICD-10-CM | POA: Diagnosis not present

## 2021-05-12 DIAGNOSIS — Z79899 Other long term (current) drug therapy: Secondary | ICD-10-CM | POA: Diagnosis not present

## 2021-05-12 DIAGNOSIS — Z794 Long term (current) use of insulin: Secondary | ICD-10-CM | POA: Diagnosis not present

## 2021-05-12 DIAGNOSIS — G4733 Obstructive sleep apnea (adult) (pediatric): Secondary | ICD-10-CM | POA: Diagnosis not present

## 2021-05-12 DIAGNOSIS — Z7984 Long term (current) use of oral hypoglycemic drugs: Secondary | ICD-10-CM | POA: Diagnosis not present

## 2021-05-12 DIAGNOSIS — Z9181 History of falling: Secondary | ICD-10-CM | POA: Diagnosis not present

## 2021-05-12 DIAGNOSIS — G8929 Other chronic pain: Secondary | ICD-10-CM | POA: Diagnosis not present

## 2021-05-12 DIAGNOSIS — E1140 Type 2 diabetes mellitus with diabetic neuropathy, unspecified: Secondary | ICD-10-CM | POA: Diagnosis not present

## 2021-05-12 DIAGNOSIS — F17210 Nicotine dependence, cigarettes, uncomplicated: Secondary | ICD-10-CM | POA: Diagnosis not present

## 2021-05-12 DIAGNOSIS — K589 Irritable bowel syndrome without diarrhea: Secondary | ICD-10-CM | POA: Diagnosis not present

## 2021-05-20 DIAGNOSIS — R21 Rash and other nonspecific skin eruption: Secondary | ICD-10-CM | POA: Diagnosis not present

## 2021-05-20 DIAGNOSIS — K5901 Slow transit constipation: Secondary | ICD-10-CM | POA: Diagnosis not present

## 2021-05-20 DIAGNOSIS — R61 Generalized hyperhidrosis: Secondary | ICD-10-CM | POA: Diagnosis not present

## 2021-05-25 DIAGNOSIS — G43709 Chronic migraine without aura, not intractable, without status migrainosus: Secondary | ICD-10-CM | POA: Diagnosis not present

## 2021-05-25 DIAGNOSIS — G43719 Chronic migraine without aura, intractable, without status migrainosus: Secondary | ICD-10-CM | POA: Diagnosis not present

## 2021-05-25 DIAGNOSIS — Z79899 Other long term (current) drug therapy: Secondary | ICD-10-CM | POA: Diagnosis not present

## 2021-05-26 DIAGNOSIS — G4733 Obstructive sleep apnea (adult) (pediatric): Secondary | ICD-10-CM | POA: Diagnosis not present

## 2021-06-01 DIAGNOSIS — E118 Type 2 diabetes mellitus with unspecified complications: Secondary | ICD-10-CM | POA: Diagnosis not present

## 2021-06-03 DIAGNOSIS — J449 Chronic obstructive pulmonary disease, unspecified: Secondary | ICD-10-CM | POA: Diagnosis not present

## 2021-06-03 DIAGNOSIS — E785 Hyperlipidemia, unspecified: Secondary | ICD-10-CM | POA: Diagnosis not present

## 2021-06-03 DIAGNOSIS — F17200 Nicotine dependence, unspecified, uncomplicated: Secondary | ICD-10-CM | POA: Diagnosis not present

## 2021-06-03 DIAGNOSIS — K219 Gastro-esophageal reflux disease without esophagitis: Secondary | ICD-10-CM | POA: Diagnosis not present

## 2021-06-03 DIAGNOSIS — R634 Abnormal weight loss: Secondary | ICD-10-CM | POA: Diagnosis not present

## 2021-06-03 DIAGNOSIS — R0789 Other chest pain: Secondary | ICD-10-CM | POA: Diagnosis not present

## 2021-06-03 DIAGNOSIS — F17210 Nicotine dependence, cigarettes, uncomplicated: Secondary | ICD-10-CM | POA: Diagnosis not present

## 2021-06-03 DIAGNOSIS — Z79899 Other long term (current) drug therapy: Secondary | ICD-10-CM | POA: Diagnosis not present

## 2021-06-03 DIAGNOSIS — G4733 Obstructive sleep apnea (adult) (pediatric): Secondary | ICD-10-CM | POA: Diagnosis not present

## 2021-06-03 DIAGNOSIS — G8929 Other chronic pain: Secondary | ICD-10-CM | POA: Diagnosis not present

## 2021-06-03 DIAGNOSIS — R0989 Other specified symptoms and signs involving the circulatory and respiratory systems: Secondary | ICD-10-CM | POA: Diagnosis not present

## 2021-06-03 DIAGNOSIS — J42 Unspecified chronic bronchitis: Secondary | ICD-10-CM | POA: Diagnosis not present

## 2021-06-03 DIAGNOSIS — D500 Iron deficiency anemia secondary to blood loss (chronic): Secondary | ICD-10-CM | POA: Diagnosis not present

## 2021-06-03 DIAGNOSIS — G43909 Migraine, unspecified, not intractable, without status migrainosus: Secondary | ICD-10-CM | POA: Diagnosis not present

## 2021-06-03 DIAGNOSIS — I259 Chronic ischemic heart disease, unspecified: Secondary | ICD-10-CM | POA: Diagnosis not present

## 2021-06-03 DIAGNOSIS — I10 Essential (primary) hypertension: Secondary | ICD-10-CM | POA: Diagnosis not present

## 2021-06-03 DIAGNOSIS — I272 Pulmonary hypertension, unspecified: Secondary | ICD-10-CM | POA: Diagnosis not present

## 2021-06-03 DIAGNOSIS — G43709 Chronic migraine without aura, not intractable, without status migrainosus: Secondary | ICD-10-CM | POA: Diagnosis not present

## 2021-06-03 DIAGNOSIS — R12 Heartburn: Secondary | ICD-10-CM | POA: Diagnosis not present

## 2021-06-03 DIAGNOSIS — Z794 Long term (current) use of insulin: Secondary | ICD-10-CM | POA: Diagnosis not present

## 2021-06-03 DIAGNOSIS — Z9181 History of falling: Secondary | ICD-10-CM | POA: Diagnosis not present

## 2021-06-03 DIAGNOSIS — E1140 Type 2 diabetes mellitus with diabetic neuropathy, unspecified: Secondary | ICD-10-CM | POA: Diagnosis not present

## 2021-06-03 DIAGNOSIS — K589 Irritable bowel syndrome without diarrhea: Secondary | ICD-10-CM | POA: Diagnosis not present

## 2021-06-03 DIAGNOSIS — I119 Hypertensive heart disease without heart failure: Secondary | ICD-10-CM | POA: Diagnosis not present

## 2021-06-03 DIAGNOSIS — I252 Old myocardial infarction: Secondary | ICD-10-CM | POA: Diagnosis not present

## 2021-06-03 DIAGNOSIS — Z7984 Long term (current) use of oral hypoglycemic drugs: Secondary | ICD-10-CM | POA: Diagnosis not present

## 2021-06-03 DIAGNOSIS — M47816 Spondylosis without myelopathy or radiculopathy, lumbar region: Secondary | ICD-10-CM | POA: Diagnosis not present

## 2021-06-03 DIAGNOSIS — E118 Type 2 diabetes mellitus with unspecified complications: Secondary | ICD-10-CM | POA: Diagnosis not present

## 2021-06-03 DIAGNOSIS — I2510 Atherosclerotic heart disease of native coronary artery without angina pectoris: Secondary | ICD-10-CM | POA: Diagnosis not present

## 2021-06-03 DIAGNOSIS — R1013 Epigastric pain: Secondary | ICD-10-CM | POA: Diagnosis not present

## 2021-06-05 DIAGNOSIS — Z1231 Encounter for screening mammogram for malignant neoplasm of breast: Secondary | ICD-10-CM | POA: Diagnosis not present

## 2021-06-08 DIAGNOSIS — I739 Peripheral vascular disease, unspecified: Secondary | ICD-10-CM | POA: Diagnosis not present

## 2021-06-11 DIAGNOSIS — R059 Cough, unspecified: Secondary | ICD-10-CM | POA: Diagnosis not present

## 2021-06-14 DIAGNOSIS — T300 Burn of unspecified body region, unspecified degree: Secondary | ICD-10-CM | POA: Diagnosis not present

## 2021-06-15 DIAGNOSIS — E119 Type 2 diabetes mellitus without complications: Secondary | ICD-10-CM | POA: Diagnosis not present

## 2021-06-15 DIAGNOSIS — D128 Benign neoplasm of rectum: Secondary | ICD-10-CM | POA: Diagnosis not present

## 2021-06-15 DIAGNOSIS — K621 Rectal polyp: Secondary | ICD-10-CM | POA: Diagnosis not present

## 2021-06-15 DIAGNOSIS — R12 Heartburn: Secondary | ICD-10-CM | POA: Diagnosis not present

## 2021-06-15 DIAGNOSIS — D500 Iron deficiency anemia secondary to blood loss (chronic): Secondary | ICD-10-CM | POA: Diagnosis not present

## 2021-06-17 DIAGNOSIS — F17200 Nicotine dependence, unspecified, uncomplicated: Secondary | ICD-10-CM | POA: Diagnosis not present

## 2021-06-17 DIAGNOSIS — I10 Essential (primary) hypertension: Secondary | ICD-10-CM | POA: Diagnosis not present

## 2021-06-17 DIAGNOSIS — E785 Hyperlipidemia, unspecified: Secondary | ICD-10-CM | POA: Diagnosis not present

## 2021-06-17 DIAGNOSIS — I259 Chronic ischemic heart disease, unspecified: Secondary | ICD-10-CM | POA: Diagnosis not present

## 2021-07-12 DIAGNOSIS — Z7984 Long term (current) use of oral hypoglycemic drugs: Secondary | ICD-10-CM | POA: Diagnosis not present

## 2021-07-12 DIAGNOSIS — I119 Hypertensive heart disease without heart failure: Secondary | ICD-10-CM | POA: Diagnosis not present

## 2021-07-12 DIAGNOSIS — E1140 Type 2 diabetes mellitus with diabetic neuropathy, unspecified: Secondary | ICD-10-CM | POA: Diagnosis not present

## 2021-07-12 DIAGNOSIS — G8929 Other chronic pain: Secondary | ICD-10-CM | POA: Diagnosis not present

## 2021-07-12 DIAGNOSIS — I2510 Atherosclerotic heart disease of native coronary artery without angina pectoris: Secondary | ICD-10-CM | POA: Diagnosis not present

## 2021-07-12 DIAGNOSIS — M47816 Spondylosis without myelopathy or radiculopathy, lumbar region: Secondary | ICD-10-CM | POA: Diagnosis not present

## 2021-07-12 DIAGNOSIS — K589 Irritable bowel syndrome without diarrhea: Secondary | ICD-10-CM | POA: Diagnosis not present

## 2021-07-12 DIAGNOSIS — G4733 Obstructive sleep apnea (adult) (pediatric): Secondary | ICD-10-CM | POA: Diagnosis not present

## 2021-07-12 DIAGNOSIS — J449 Chronic obstructive pulmonary disease, unspecified: Secondary | ICD-10-CM | POA: Diagnosis not present

## 2021-07-12 DIAGNOSIS — F17210 Nicotine dependence, cigarettes, uncomplicated: Secondary | ICD-10-CM | POA: Diagnosis not present

## 2021-07-12 DIAGNOSIS — Z9181 History of falling: Secondary | ICD-10-CM | POA: Diagnosis not present

## 2021-07-12 DIAGNOSIS — Z794 Long term (current) use of insulin: Secondary | ICD-10-CM | POA: Diagnosis not present

## 2021-07-12 DIAGNOSIS — I272 Pulmonary hypertension, unspecified: Secondary | ICD-10-CM | POA: Diagnosis not present

## 2021-07-12 DIAGNOSIS — Z79899 Other long term (current) drug therapy: Secondary | ICD-10-CM | POA: Diagnosis not present

## 2021-07-12 DIAGNOSIS — E785 Hyperlipidemia, unspecified: Secondary | ICD-10-CM | POA: Diagnosis not present

## 2021-07-12 DIAGNOSIS — G43909 Migraine, unspecified, not intractable, without status migrainosus: Secondary | ICD-10-CM | POA: Diagnosis not present

## 2021-07-20 DIAGNOSIS — E118 Type 2 diabetes mellitus with unspecified complications: Secondary | ICD-10-CM | POA: Diagnosis not present

## 2021-07-22 DIAGNOSIS — E119 Type 2 diabetes mellitus without complications: Secondary | ICD-10-CM | POA: Diagnosis not present

## 2021-07-22 DIAGNOSIS — R0789 Other chest pain: Secondary | ICD-10-CM | POA: Diagnosis not present

## 2021-07-22 DIAGNOSIS — E785 Hyperlipidemia, unspecified: Secondary | ICD-10-CM | POA: Diagnosis not present

## 2021-07-22 DIAGNOSIS — Z7984 Long term (current) use of oral hypoglycemic drugs: Secondary | ICD-10-CM | POA: Diagnosis not present

## 2021-07-22 DIAGNOSIS — F17210 Nicotine dependence, cigarettes, uncomplicated: Secondary | ICD-10-CM | POA: Diagnosis not present

## 2021-07-22 DIAGNOSIS — I10 Essential (primary) hypertension: Secondary | ICD-10-CM | POA: Diagnosis not present

## 2021-07-22 DIAGNOSIS — J449 Chronic obstructive pulmonary disease, unspecified: Secondary | ICD-10-CM | POA: Diagnosis not present

## 2021-07-22 DIAGNOSIS — R0600 Dyspnea, unspecified: Secondary | ICD-10-CM | POA: Diagnosis not present

## 2021-07-22 DIAGNOSIS — E118 Type 2 diabetes mellitus with unspecified complications: Secondary | ICD-10-CM | POA: Diagnosis not present

## 2021-07-30 DIAGNOSIS — E785 Hyperlipidemia, unspecified: Secondary | ICD-10-CM | POA: Diagnosis not present

## 2021-07-30 DIAGNOSIS — M47816 Spondylosis without myelopathy or radiculopathy, lumbar region: Secondary | ICD-10-CM | POA: Diagnosis not present

## 2021-07-30 DIAGNOSIS — F17210 Nicotine dependence, cigarettes, uncomplicated: Secondary | ICD-10-CM | POA: Diagnosis not present

## 2021-07-30 DIAGNOSIS — J449 Chronic obstructive pulmonary disease, unspecified: Secondary | ICD-10-CM | POA: Diagnosis not present

## 2021-07-30 DIAGNOSIS — Z794 Long term (current) use of insulin: Secondary | ICD-10-CM | POA: Diagnosis not present

## 2021-07-30 DIAGNOSIS — Z7984 Long term (current) use of oral hypoglycemic drugs: Secondary | ICD-10-CM | POA: Diagnosis not present

## 2021-07-30 DIAGNOSIS — G4733 Obstructive sleep apnea (adult) (pediatric): Secondary | ICD-10-CM | POA: Diagnosis not present

## 2021-07-30 DIAGNOSIS — K589 Irritable bowel syndrome without diarrhea: Secondary | ICD-10-CM | POA: Diagnosis not present

## 2021-07-30 DIAGNOSIS — I2510 Atherosclerotic heart disease of native coronary artery without angina pectoris: Secondary | ICD-10-CM | POA: Diagnosis not present

## 2021-07-30 DIAGNOSIS — Z9181 History of falling: Secondary | ICD-10-CM | POA: Diagnosis not present

## 2021-07-30 DIAGNOSIS — G8929 Other chronic pain: Secondary | ICD-10-CM | POA: Diagnosis not present

## 2021-07-30 DIAGNOSIS — I272 Pulmonary hypertension, unspecified: Secondary | ICD-10-CM | POA: Diagnosis not present

## 2021-07-30 DIAGNOSIS — E1140 Type 2 diabetes mellitus with diabetic neuropathy, unspecified: Secondary | ICD-10-CM | POA: Diagnosis not present

## 2021-07-30 DIAGNOSIS — Z79899 Other long term (current) drug therapy: Secondary | ICD-10-CM | POA: Diagnosis not present

## 2021-07-30 DIAGNOSIS — I119 Hypertensive heart disease without heart failure: Secondary | ICD-10-CM | POA: Diagnosis not present

## 2021-07-30 DIAGNOSIS — G43909 Migraine, unspecified, not intractable, without status migrainosus: Secondary | ICD-10-CM | POA: Diagnosis not present

## 2021-08-02 DIAGNOSIS — Z0000 Encounter for general adult medical examination without abnormal findings: Secondary | ICD-10-CM | POA: Diagnosis not present

## 2021-08-02 DIAGNOSIS — E118 Type 2 diabetes mellitus with unspecified complications: Secondary | ICD-10-CM | POA: Diagnosis not present

## 2021-08-02 DIAGNOSIS — F17200 Nicotine dependence, unspecified, uncomplicated: Secondary | ICD-10-CM | POA: Diagnosis not present

## 2021-08-02 DIAGNOSIS — R109 Unspecified abdominal pain: Secondary | ICD-10-CM | POA: Diagnosis not present

## 2021-08-03 DIAGNOSIS — R0609 Other forms of dyspnea: Secondary | ICD-10-CM | POA: Diagnosis not present

## 2021-08-03 DIAGNOSIS — G4733 Obstructive sleep apnea (adult) (pediatric): Secondary | ICD-10-CM | POA: Diagnosis not present

## 2021-08-03 DIAGNOSIS — J439 Emphysema, unspecified: Secondary | ICD-10-CM | POA: Diagnosis not present

## 2021-08-03 DIAGNOSIS — Z23 Encounter for immunization: Secondary | ICD-10-CM | POA: Diagnosis not present

## 2021-08-03 DIAGNOSIS — Z8679 Personal history of other diseases of the circulatory system: Secondary | ICD-10-CM | POA: Diagnosis not present

## 2021-08-03 DIAGNOSIS — F17210 Nicotine dependence, cigarettes, uncomplicated: Secondary | ICD-10-CM | POA: Diagnosis not present

## 2021-08-04 DIAGNOSIS — G4733 Obstructive sleep apnea (adult) (pediatric): Secondary | ICD-10-CM | POA: Diagnosis not present

## 2021-08-04 DIAGNOSIS — R52 Pain, unspecified: Secondary | ICD-10-CM | POA: Diagnosis not present

## 2021-08-04 DIAGNOSIS — G479 Sleep disorder, unspecified: Secondary | ICD-10-CM | POA: Diagnosis not present

## 2021-08-10 DIAGNOSIS — J439 Emphysema, unspecified: Secondary | ICD-10-CM | POA: Diagnosis not present

## 2021-08-10 DIAGNOSIS — F17210 Nicotine dependence, cigarettes, uncomplicated: Secondary | ICD-10-CM | POA: Diagnosis not present

## 2021-08-23 DIAGNOSIS — F17210 Nicotine dependence, cigarettes, uncomplicated: Secondary | ICD-10-CM | POA: Diagnosis not present

## 2021-08-23 DIAGNOSIS — J439 Emphysema, unspecified: Secondary | ICD-10-CM | POA: Diagnosis not present

## 2021-08-25 DIAGNOSIS — F17210 Nicotine dependence, cigarettes, uncomplicated: Secondary | ICD-10-CM | POA: Diagnosis not present

## 2021-08-25 DIAGNOSIS — J439 Emphysema, unspecified: Secondary | ICD-10-CM | POA: Diagnosis not present

## 2021-08-26 DIAGNOSIS — I252 Old myocardial infarction: Secondary | ICD-10-CM | POA: Diagnosis not present

## 2021-08-26 DIAGNOSIS — I272 Pulmonary hypertension, unspecified: Secondary | ICD-10-CM | POA: Diagnosis not present

## 2021-08-26 DIAGNOSIS — I259 Chronic ischemic heart disease, unspecified: Secondary | ICD-10-CM | POA: Diagnosis not present

## 2021-08-27 DIAGNOSIS — G4733 Obstructive sleep apnea (adult) (pediatric): Secondary | ICD-10-CM | POA: Diagnosis not present

## 2021-08-30 DIAGNOSIS — J439 Emphysema, unspecified: Secondary | ICD-10-CM | POA: Diagnosis not present

## 2021-08-30 DIAGNOSIS — F17210 Nicotine dependence, cigarettes, uncomplicated: Secondary | ICD-10-CM | POA: Diagnosis not present

## 2021-09-01 DIAGNOSIS — J439 Emphysema, unspecified: Secondary | ICD-10-CM | POA: Diagnosis not present

## 2021-09-01 DIAGNOSIS — F17210 Nicotine dependence, cigarettes, uncomplicated: Secondary | ICD-10-CM | POA: Diagnosis not present

## 2021-09-05 DIAGNOSIS — M25461 Effusion, right knee: Secondary | ICD-10-CM | POA: Diagnosis not present

## 2021-09-05 DIAGNOSIS — R0609 Other forms of dyspnea: Secondary | ICD-10-CM | POA: Diagnosis not present

## 2021-09-05 DIAGNOSIS — M25561 Pain in right knee: Secondary | ICD-10-CM | POA: Diagnosis not present

## 2021-09-05 DIAGNOSIS — J438 Other emphysema: Secondary | ICD-10-CM | POA: Diagnosis not present

## 2021-09-05 DIAGNOSIS — M1711 Unilateral primary osteoarthritis, right knee: Secondary | ICD-10-CM | POA: Diagnosis not present

## 2021-09-06 DIAGNOSIS — F17210 Nicotine dependence, cigarettes, uncomplicated: Secondary | ICD-10-CM | POA: Diagnosis not present

## 2021-09-06 DIAGNOSIS — J439 Emphysema, unspecified: Secondary | ICD-10-CM | POA: Diagnosis not present

## 2021-09-07 DIAGNOSIS — Z713 Dietary counseling and surveillance: Secondary | ICD-10-CM | POA: Diagnosis not present

## 2021-09-07 DIAGNOSIS — R739 Hyperglycemia, unspecified: Secondary | ICD-10-CM | POA: Diagnosis not present

## 2021-09-13 DIAGNOSIS — J439 Emphysema, unspecified: Secondary | ICD-10-CM | POA: Diagnosis not present

## 2021-09-13 DIAGNOSIS — F17210 Nicotine dependence, cigarettes, uncomplicated: Secondary | ICD-10-CM | POA: Diagnosis not present

## 2021-09-14 DIAGNOSIS — G43719 Chronic migraine without aura, intractable, without status migrainosus: Secondary | ICD-10-CM | POA: Diagnosis not present

## 2021-09-14 DIAGNOSIS — G43709 Chronic migraine without aura, not intractable, without status migrainosus: Secondary | ICD-10-CM | POA: Diagnosis not present

## 2021-09-15 DIAGNOSIS — F17210 Nicotine dependence, cigarettes, uncomplicated: Secondary | ICD-10-CM | POA: Diagnosis not present

## 2021-09-15 DIAGNOSIS — J439 Emphysema, unspecified: Secondary | ICD-10-CM | POA: Diagnosis not present

## 2021-09-24 DIAGNOSIS — M47816 Spondylosis without myelopathy or radiculopathy, lumbar region: Secondary | ICD-10-CM | POA: Diagnosis not present

## 2021-09-24 DIAGNOSIS — Z9181 History of falling: Secondary | ICD-10-CM | POA: Diagnosis not present

## 2021-09-24 DIAGNOSIS — G4733 Obstructive sleep apnea (adult) (pediatric): Secondary | ICD-10-CM | POA: Diagnosis not present

## 2021-09-24 DIAGNOSIS — R051 Acute cough: Secondary | ICD-10-CM | POA: Diagnosis not present

## 2021-09-24 DIAGNOSIS — Z794 Long term (current) use of insulin: Secondary | ICD-10-CM | POA: Diagnosis not present

## 2021-09-24 DIAGNOSIS — I2510 Atherosclerotic heart disease of native coronary artery without angina pectoris: Secondary | ICD-10-CM | POA: Diagnosis not present

## 2021-09-24 DIAGNOSIS — Z7982 Long term (current) use of aspirin: Secondary | ICD-10-CM | POA: Diagnosis not present

## 2021-09-24 DIAGNOSIS — I119 Hypertensive heart disease without heart failure: Secondary | ICD-10-CM | POA: Diagnosis not present

## 2021-09-24 DIAGNOSIS — J449 Chronic obstructive pulmonary disease, unspecified: Secondary | ICD-10-CM | POA: Diagnosis not present

## 2021-09-24 DIAGNOSIS — I272 Pulmonary hypertension, unspecified: Secondary | ICD-10-CM | POA: Diagnosis not present

## 2021-09-24 DIAGNOSIS — G43909 Migraine, unspecified, not intractable, without status migrainosus: Secondary | ICD-10-CM | POA: Diagnosis not present

## 2021-09-24 DIAGNOSIS — Z79899 Other long term (current) drug therapy: Secondary | ICD-10-CM | POA: Diagnosis not present

## 2021-09-24 DIAGNOSIS — R062 Wheezing: Secondary | ICD-10-CM | POA: Diagnosis not present

## 2021-09-24 DIAGNOSIS — E1140 Type 2 diabetes mellitus with diabetic neuropathy, unspecified: Secondary | ICD-10-CM | POA: Diagnosis not present

## 2021-09-24 DIAGNOSIS — Z7984 Long term (current) use of oral hypoglycemic drugs: Secondary | ICD-10-CM | POA: Diagnosis not present

## 2021-09-24 DIAGNOSIS — K589 Irritable bowel syndrome without diarrhea: Secondary | ICD-10-CM | POA: Diagnosis not present

## 2021-09-24 DIAGNOSIS — G8929 Other chronic pain: Secondary | ICD-10-CM | POA: Diagnosis not present

## 2021-09-24 DIAGNOSIS — F17210 Nicotine dependence, cigarettes, uncomplicated: Secondary | ICD-10-CM | POA: Diagnosis not present

## 2021-09-24 DIAGNOSIS — E785 Hyperlipidemia, unspecified: Secondary | ICD-10-CM | POA: Diagnosis not present

## 2023-04-17 ENCOUNTER — Telehealth: Payer: Self-pay | Admitting: Family Medicine

## 2023-04-17 NOTE — Telephone Encounter (Signed)
Called Christine Hebert re death of her husband, Kathlene November on Apr 05, 2023.  She agreed for me to reach out to her in next few weeks as she has little family and is feeling alone.
